# Patient Record
Sex: Female | Born: 1960 | Race: White | Hispanic: No | Marital: Married | Smoking: Never smoker
Health system: Southern US, Community
[De-identification: ages and names within clinical notes are randomized; demographics above are authoritative.]

## PROBLEM LIST (undated history)

## (undated) DIAGNOSIS — Z9889 Other specified postprocedural states: Secondary | ICD-10-CM

## (undated) DIAGNOSIS — G43909 Migraine, unspecified, not intractable, without status migrainosus: Secondary | ICD-10-CM

## (undated) DIAGNOSIS — Q159 Congenital malformation of eye, unspecified: Secondary | ICD-10-CM

## (undated) DIAGNOSIS — Q8502 Neurofibromatosis, type 2: Secondary | ICD-10-CM

## (undated) DIAGNOSIS — D320 Benign neoplasm of cerebral meninges: Secondary | ICD-10-CM

## (undated) DIAGNOSIS — M797 Fibromyalgia: Secondary | ICD-10-CM

## (undated) DIAGNOSIS — Z87442 Personal history of urinary calculi: Secondary | ICD-10-CM

## (undated) DIAGNOSIS — N200 Calculus of kidney: Secondary | ICD-10-CM

## (undated) DIAGNOSIS — Z8489 Family history of other specified conditions: Secondary | ICD-10-CM

## (undated) DIAGNOSIS — E039 Hypothyroidism, unspecified: Secondary | ICD-10-CM

## (undated) DIAGNOSIS — R0600 Dyspnea, unspecified: Secondary | ICD-10-CM

## (undated) DIAGNOSIS — M1990 Unspecified osteoarthritis, unspecified site: Secondary | ICD-10-CM

## (undated) DIAGNOSIS — E785 Hyperlipidemia, unspecified: Secondary | ICD-10-CM

## (undated) DIAGNOSIS — R569 Unspecified convulsions: Secondary | ICD-10-CM

## (undated) DIAGNOSIS — T883XXA Malignant hyperthermia due to anesthesia, initial encounter: Secondary | ICD-10-CM

## (undated) DIAGNOSIS — E063 Autoimmune thyroiditis: Secondary | ICD-10-CM

## (undated) DIAGNOSIS — R112 Nausea with vomiting, unspecified: Secondary | ICD-10-CM

## (undated) DIAGNOSIS — F419 Anxiety disorder, unspecified: Secondary | ICD-10-CM

## (undated) DIAGNOSIS — G40909 Epilepsy, unspecified, not intractable, without status epilepticus: Secondary | ICD-10-CM

## (undated) DIAGNOSIS — H4901 Third [oculomotor] nerve palsy, right eye: Secondary | ICD-10-CM

## (undated) DIAGNOSIS — H0520 Unspecified exophthalmos: Secondary | ICD-10-CM

## (undated) DIAGNOSIS — E7800 Pure hypercholesterolemia, unspecified: Secondary | ICD-10-CM

## (undated) DIAGNOSIS — H02103 Unspecified ectropion of right eye, unspecified eyelid: Secondary | ICD-10-CM

## (undated) DIAGNOSIS — D329 Benign neoplasm of meninges, unspecified: Secondary | ICD-10-CM

## (undated) HISTORY — PX: COLONOSCOPY: SHX174

## (undated) HISTORY — DX: Fibromyalgia: M79.7

## (undated) HISTORY — PX: WRIST ARTHROPLASTY: SHX1088

## (undated) HISTORY — PX: EYE SURGERY: SHX253

## (undated) HISTORY — DX: Migraine, unspecified, not intractable, without status migrainosus: G43.909

## (undated) HISTORY — PX: OTHER SURGICAL HISTORY: SHX169

## (undated) HISTORY — PX: BRAIN SURGERY: SHX531

## (undated) HISTORY — DX: Unspecified osteoarthritis, unspecified site: M19.90

## (undated) HISTORY — DX: Neurofibromatosis, type 2: Q85.02

## (undated) HISTORY — DX: Calculus of kidney: N20.0

## (undated) HISTORY — DX: Benign neoplasm of cerebral meninges: D32.0

## (undated) HISTORY — PX: UPPER GI ENDOSCOPY: SHX6162

## (undated) HISTORY — PX: BACK SURGERY: SHX140

## (undated) HISTORY — PX: SHOULDER SURGERY: SHX246

## (undated) HISTORY — DX: Autoimmune thyroiditis: E06.3

## (undated) HISTORY — PX: CARPAL TUNNEL RELEASE: SHX101

## (undated) HISTORY — DX: Unspecified ectropion of right eye, unspecified eyelid: H02.103

## (undated) HISTORY — DX: Benign neoplasm of meninges, unspecified: D32.9

## (undated) HISTORY — PX: CRANIOTOMY: SHX93

## (undated) HISTORY — DX: Epilepsy, unspecified, not intractable, without status epilepticus: G40.909

## (undated) HISTORY — PX: BRAIN TUMOR EXCISION: SHX577

## (undated) HISTORY — DX: Neurofibromatosis, type 2 (CMS-HCC): Q85.02

## (undated) HISTORY — DX: Pure hypercholesterolemia, unspecified: E78.00

## (undated) HISTORY — DX: Third (oculomotor) nerve palsy, right eye: H49.01

## (undated) HISTORY — DX: Hypothyroidism, unspecified: E03.9

## (undated) HISTORY — DX: Unspecified exophthalmos: H05.20

---

## 2002-03-06 HISTORY — PX: LASIK: SHX215

## 2012-12-26 DIAGNOSIS — D496 Neoplasm of unspecified behavior of brain: Secondary | ICD-10-CM | POA: Insufficient documentation

## 2013-05-21 ENCOUNTER — Ambulatory Visit: Payer: Self-pay | Admitting: Ophthalmology

## 2013-07-07 ENCOUNTER — Ambulatory Visit: Payer: Self-pay | Admitting: Ophthalmology

## 2013-07-09 ENCOUNTER — Ambulatory Visit: Payer: Self-pay | Admitting: Ophthalmology

## 2013-09-21 DIAGNOSIS — D72819 Decreased white blood cell count, unspecified: Secondary | ICD-10-CM | POA: Insufficient documentation

## 2013-09-21 DIAGNOSIS — N3000 Acute cystitis without hematuria: Secondary | ICD-10-CM | POA: Insufficient documentation

## 2013-09-21 DIAGNOSIS — B349 Viral infection, unspecified: Secondary | ICD-10-CM | POA: Insufficient documentation

## 2013-09-21 DIAGNOSIS — R569 Unspecified convulsions: Secondary | ICD-10-CM | POA: Insufficient documentation

## 2013-09-22 ENCOUNTER — Ambulatory Visit: Payer: Self-pay | Admitting: Ophthalmology

## 2013-12-24 ENCOUNTER — Ambulatory Visit: Payer: Self-pay | Admitting: Ophthalmology

## 2013-12-25 ENCOUNTER — Ambulatory Visit: Payer: Self-pay

## 2014-01-08 ENCOUNTER — Ambulatory Visit: Payer: Self-pay | Admitting: Ophthalmology

## 2014-02-04 ENCOUNTER — Ambulatory Visit: Payer: Self-pay | Admitting: Neurology

## 2014-02-04 ENCOUNTER — Ambulatory Visit: Payer: Self-pay

## 2014-02-23 ENCOUNTER — Ambulatory Visit: Payer: Self-pay | Admitting: Neurology

## 2014-03-26 ENCOUNTER — Ambulatory Visit: Payer: Self-pay | Admitting: Neurology

## 2014-05-06 ENCOUNTER — Ambulatory Visit: Payer: Self-pay | Admitting: Neurology

## 2014-05-06 LAB — MISCELLANEOUS TEST (CHEMISTRY)

## 2014-05-06 LAB — LABCUM (HISTORIC)

## 2014-07-10 ENCOUNTER — Ambulatory Visit: Payer: Self-pay | Admitting: Neurology

## 2014-08-18 ENCOUNTER — Ambulatory Visit: Payer: Self-pay | Admitting: Neurology

## 2015-02-23 ENCOUNTER — Ambulatory Visit: Payer: Self-pay | Admitting: Neurology

## 2015-05-12 ENCOUNTER — Ambulatory Visit: Payer: Self-pay | Admitting: Ophthalmology

## 2015-06-09 ENCOUNTER — Ambulatory Visit: Payer: Self-pay | Admitting: Ophthalmology

## 2015-07-21 ENCOUNTER — Ambulatory Visit: Payer: Self-pay | Admitting: Ophthalmology

## 2015-08-04 ENCOUNTER — Ambulatory Visit: Payer: Self-pay | Admitting: Neurology

## 2015-09-14 ENCOUNTER — Ambulatory Visit: Payer: Self-pay | Admitting: Neurology

## 2015-10-18 ENCOUNTER — Ambulatory Visit: Payer: Self-pay | Admitting: Ophthalmology

## 2016-01-04 DIAGNOSIS — D329 Benign neoplasm of meninges, unspecified: Secondary | ICD-10-CM | POA: Insufficient documentation

## 2016-01-04 DIAGNOSIS — Q8502 Neurofibromatosis, type 2: Secondary | ICD-10-CM | POA: Insufficient documentation

## 2016-01-17 ENCOUNTER — Encounter: Payer: Self-pay | Admitting: Ophthalmology

## 2016-01-17 ENCOUNTER — Ambulatory Visit: Payer: BLUE CROSS/BLUE SHIELD | Attending: Ophthalmology | Admitting: Ophthalmology

## 2016-01-17 ENCOUNTER — Ambulatory Visit: Payer: Self-pay | Admitting: Ophthalmology

## 2016-01-17 DIAGNOSIS — H4901 Third [oculomotor] nerve palsy, right eye: Principal | ICD-10-CM | POA: Insufficient documentation

## 2016-01-17 DIAGNOSIS — D329 Benign neoplasm of meninges, unspecified: Secondary | ICD-10-CM

## 2016-01-17 DIAGNOSIS — Q8502 Neurofibromatosis, type 2: Secondary | ICD-10-CM

## 2016-01-17 DIAGNOSIS — G40909 Epilepsy, unspecified, not intractable, without status epilepticus: Secondary | ICD-10-CM

## 2016-01-17 MED ORDER — HYDROCODONE-ACETAMINOPHEN 10-325 MG OR TABS: 1.00 | ORAL_TABLET | Freq: Four times a day (QID) | ORAL | Status: AC | PRN

## 2016-01-17 MED ORDER — ACETYLCYSTEINE 500 MG PO CAPS
1.00 | ORAL_CAPSULE | Freq: Two times a day (BID) | ORAL | Status: DC
Start: ? — End: 2017-01-17

## 2016-01-17 MED ORDER — LEVOTHYROXINE SODIUM 100 MCG OR TABS: 88.00 ug | ORAL_TABLET | Freq: Every day | ORAL | Status: AC

## 2016-01-17 MED ORDER — FENTANYL 62.5 MCG/HR TD PT72
1.00 | MEDICATED_PATCH | TRANSDERMAL | Status: DC
Start: ? — End: 2017-01-17

## 2016-01-17 MED ORDER — OMEGA-3 FISH OIL 1000 MG PO CAPS
1.00 g | ORAL_CAPSULE | Freq: Every day | ORAL | Status: DC
Start: ? — End: 2017-01-17

## 2016-01-17 MED ORDER — FOLIC ACID 1 MG OR TABS
1.00 mg | ORAL_TABLET | Freq: Every day | ORAL | Status: DC
Start: ? — End: 2017-05-16

## 2016-01-17 MED ORDER — VITAMIN B-12 1000 MCG OR TABS
1000.00 ug | ORAL_TABLET | Freq: Every day | ORAL | Status: DC
Start: ? — End: 2017-09-12

## 2016-01-17 MED ORDER — LEVETIRACETAM 1000 MG OR TABS
1000.00 mg | ORAL_TABLET | Freq: Two times a day (BID) | ORAL | Status: DC
Start: ? — End: 2017-01-17

## 2016-01-17 MED ORDER — MAGNESIUM 400 MG PO CAPS
400.00 mg | ORAL_CAPSULE | Freq: Every day | ORAL | Status: DC
Start: ? — End: 2017-01-17

## 2016-01-17 MED ORDER — CLONIDINE HCL 0.2 MG OR TABS: 0.20 mg | ORAL_TABLET | Freq: Two times a day (BID) | ORAL | Status: AC

## 2016-01-17 MED ORDER — FLUTICASONE PROPIONATE  HFA 44 MCG/ACT IN AERO
1.00 | INHALATION_SPRAY | RESPIRATORY_TRACT | Status: DC | PRN
Start: ? — End: 2017-05-16

## 2016-01-17 MED ORDER — VITAMIN D3 1000 UNIT OR TABS
1000.00 [IU] | ORAL_TABLET | Freq: Every day | ORAL | Status: DC
Start: ? — End: 2017-09-12

## 2016-01-17 MED ORDER — ONDANSETRON HCL 8 MG OR TABS
8.00 mg | ORAL_TABLET | Freq: Three times a day (TID) | ORAL | Status: DC | PRN
Start: ? — End: 2017-05-16

## 2016-01-17 MED ORDER — TRIAMTERENE-HCTZ 37.5-25 MG OR TABS
1.00 | ORAL_TABLET | Freq: Every day | ORAL | Status: DC
Start: ? — End: 2017-05-16

## 2016-01-17 MED ORDER — ALPHA LIPOIC ACID PO
1.00 | Freq: Every day | ORAL | Status: DC
Start: ? — End: 2017-01-17

## 2016-01-17 MED ORDER — CLONAZEPAM 1 MG OR TABS
1.00 mg | ORAL_TABLET | Freq: Two times a day (BID) | ORAL | Status: DC
Start: ? — End: 2017-09-12

## 2016-01-17 MED ORDER — DOCUSATE SODIUM 100 MG OR CAPS
100.00 mg | ORAL_CAPSULE | Freq: Two times a day (BID) | ORAL | Status: DC
Start: ? — End: 2017-01-17

## 2016-01-17 NOTE — Progress Notes (Signed)
ICD-10-CM ICD-9-CM    1. 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 Meningioma (CMS-HCC) D32.9 225.2    4. 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  She developed a seizure disorder as well following surgical removal.  The seizure disorder was extremely difficult to control with 2-3 seizures per week.  This is despite maximal medical therapy as well as a vagal nerve stimulator.  She was told to patch the right eye and her seizure disorder significantly improved.  She is now having 1 seizure every 3 weeks.  She is beginning to taper some of her seizure medications as well.    Patient's exam is largely unchanged.  She still has a partial third nerve palsy including ptosis of the right upper eyelid.  She wears a patch full time except for exam.    Overall I believe she is doing quite well.  Her OCT does not demonstrate any evidence of compressive neuropathy at this time.  I will see her back in 4 months to recheck.        I performed the above HPI. I reviewed and confirmed the techs review of systems, past histories, and readings.

## 2016-03-03 ENCOUNTER — Telehealth: Payer: Self-pay | Admitting: Neurology

## 2016-03-03 NOTE — Telephone Encounter (Signed)
I left a message informing patient that Dr. Vickey Sages will be out of the office on 03/14/2016 and her appointment at 2:30PM will need to be reschedule.

## 2016-03-08 ENCOUNTER — Telehealth: Payer: Self-pay | Admitting: Neurology

## 2016-03-08 NOTE — Telephone Encounter (Signed)
Patient called stating she called to reschedule her appointmnet to see Dr.Bota for 1/23, but she wants to know if she should have an MRI , she has not gotten one done recently.

## 2016-03-14 ENCOUNTER — Ambulatory Visit: Payer: BLUE CROSS/BLUE SHIELD | Admitting: Neurology

## 2016-03-14 ENCOUNTER — Ambulatory Visit: Payer: Self-pay | Admitting: Neurology

## 2016-03-28 ENCOUNTER — Telehealth: Payer: Self-pay | Admitting: Neurology

## 2016-03-28 ENCOUNTER — Ambulatory Visit: Payer: BLUE CROSS/BLUE SHIELD | Admitting: Neurology

## 2016-03-28 DIAGNOSIS — D329 Benign neoplasm of meninges, unspecified: Secondary | ICD-10-CM

## 2016-03-28 DIAGNOSIS — Q8502 Neurofibromatosis, type 2: Secondary | ICD-10-CM

## 2016-03-28 NOTE — Progress Notes (Signed)
MRI brain and IAC ordered. Message sent to [place]. I spoke with patient to inform her. Appt rescheduled to 04-11-16. Patient has new insurance ID# [id number] Group# [id number] - Husband Lakota Olguin DOB# 07/01[birth year]63.

## 2016-03-28 NOTE — Telephone Encounter (Signed)
Patient had called asking to speak to you regarding her scheduling.  Patient stated you had spoken to her in the AM.  Please call her back at [phone]

## 2016-03-28 NOTE — Telephone Encounter (Signed)
Patient is scheduled for MRI brain/IAC on 04-20-16. Follow up rescheduled to 04-25-16.

## 2016-04-11 ENCOUNTER — Ambulatory Visit: Payer: BLUE CROSS/BLUE SHIELD | Admitting: Neurology

## 2016-04-25 ENCOUNTER — Encounter: Payer: Self-pay | Admitting: Neurology

## 2016-04-25 ENCOUNTER — Ambulatory Visit: Payer: BC Managed Care – HMO | Admitting: Neurology

## 2016-04-25 VITALS — BP 144/87 | HR 110 | Temp 98.1°F | Resp 20 | Ht 64.0 in | Wt 135.4 lb

## 2016-04-25 DIAGNOSIS — D329 Benign neoplasm of meninges, unspecified: Principal | ICD-10-CM

## 2016-04-25 DIAGNOSIS — Q8502 Neurofibromatosis, type 2: Secondary | ICD-10-CM

## 2016-04-25 DIAGNOSIS — R569 Unspecified convulsions: Secondary | ICD-10-CM

## 2016-04-25 DIAGNOSIS — G529 Cranial nerve disorder, unspecified: Secondary | ICD-10-CM

## 2016-04-25 DIAGNOSIS — H532 Diplopia: Secondary | ICD-10-CM

## 2016-04-25 MED ORDER — GABAPENTIN 300 MG OR CAPS
ORAL_CAPSULE | ORAL | Status: DC
Start: 2016-03-07 — End: 2017-05-16

## 2016-04-25 MED ORDER — LORAZEPAM 1 MG OR TABS
ORAL_TABLET | ORAL | Status: AC
Start: 2016-04-22 — End: ?

## 2016-04-25 MED ORDER — FENTANYL 50 MCG/HR TD PT72
MEDICATED_PATCH | TRANSDERMAL | Status: DC
Start: 2016-04-18 — End: 2017-01-17

## 2016-04-25 MED ORDER — ESTRACE 0.1 MG/GM VA CREA
TOPICAL_CREAM | VAGINAL | Status: DC
Start: 2016-04-22 — End: 2017-09-12

## 2016-04-25 MED ORDER — SYNTHROID 112 MCG OR TABS
ORAL_TABLET | ORAL | Status: DC
Start: 2016-03-18 — End: 2017-01-17

## 2016-04-25 MED ORDER — LEVETIRACETAM 500 MG OR TABS
ORAL_TABLET | ORAL | Status: AC
Start: 2016-02-13 — End: ?

## 2016-04-25 NOTE — Progress Notes (Signed)
NEURO-ONCOLOGY PROGRESS NOTE     04/25/16     History of Present Illness:    Patient is a 56 year old woman with history of 3rd, 4th and 5th (V1 and V2) nerve palsy due to a meningioma status post craniotomy and resection and L spine meningioma status post resection by Dr. Marlana Salvage at [facility] who presents as a referral from Dr. Milinda Cave for genetic testing. Patient has a history of a meningioma of the spine in the thoracic region who presented with unexplained back pain and ultimately was found to have a meningioma compressing the spinal cord. She had surgery in 2012 and was left with persistent right-sided leg weakness. This has improved with physical therapy 2 days per week.   Regarding brain meningioma, in May 2014 the patient noted that she was leaning to the right. She had numbness in the pinky and fourth fingers of both hands. Her buttocks also became numb when sitting. She eventually saw neurologist and MRI brain was performed on 10/24/12 that showed a large right sphenoid wing extra-axial avidly enhancing mass with appearance characteristic of a meningioma. The mass extended into the right cavernous sinus and into the sella turcica that could not be separated from the pituitary gland. Mass effect on the cranial nerve was transversing the right cavernous sinus, right frontal lobe, and the posterior aspect of the right optic nerve. On 12/26/2012, Dr. Glennon Mac performed a frontotemporal craniotomy and subtotal resection of intradural tumor complicated by right frontal hemorrhage and postoperative seizures. After surgery, patient had right lid ptosis and 3rd nerve palsy. Patient has double vision when both of her eyes are open.    Interval History:    04/25/2016 RTC today to follow up with MRI brain. The patient reported that she had recurrent seizures x2 around 1 am today. Her husband stated that her body was rigid. The patient stated that she still feels foggy. During  our converstaion, the patient had recurrent generalized seizures x2, each episode lasted for less than 2 minutes; Vital signs were stable after seizure. The patient woke up and able to answer questions after the seizure episodes. After approximately 5 -17mins resting, the patient and her husband prefers to go home. Refused to go to ED. The patient reported that her epilepsy specialist Dr. Edsel Petrin has been adjusting her VNS and lowering her Keppra dosage. She saw him last week.        Past Medical History:   Diagnosis Date    Central NF2 neurofibromatosis (CMS-HCC)     Ectropion of right eye     Epilepsia (CMS-HCC)     Exophthalmus     Fibromyalgia     High cholesterol     High cholesterol     Hypothyroidism     Meningioma (CMS-HCC)     Third nerve palsy of right eye         Allergies   Allergen Reactions    Latex Other    Penicillins Other    Sulfa Drugs Other          Review of Systems:                12-point-review system has been completed. No significant symptoms except as described in the HPI and interval history    PROBLEM LISTS  No diagnosis found.       Current Medications:      Current Outpatient Prescriptions:     Acetylcysteine 500 MG CAPS, Take 1 capsule by mouth 2 times  daily., Disp: , Rfl:     ALPHA LIPOIC ACID PO, Take 1 tablet by mouth daily., Disp: , Rfl:     Cholecalciferol (VITAMIN D3) 1000 UNITS tablet, Take 1,000 Units by mouth daily., Disp: , Rfl:     clonazePAM (KLONOPIN) 1 MG tablet, Take 1 mg by mouth 2 times daily., Disp: , Rfl:     cloNIDine (CATAPRESS) 0.2 MG tablet, Take 0.2 mg by mouth 2 times daily., Disp: , Rfl:     docusate sodium (COLACE) 100 MG capsule, Take 100 mg by mouth 2 times daily., Disp: , Rfl:     ESTRACE VAGINAL 0.1 MG/GM vaginal cream, , Disp: , Rfl:     fentaNYL (DURAGESIC) 50 MCG/HR patch, , Disp: , Rfl:     FentaNYL 62.5 MCG/HR PT72, Apply 1 patch topically every 72 hours., Disp: , Rfl:      fluticasone (FLOVENT HFA) 44 MCG/ACT inhaler, Inhale 1 puff by mouth as needed., Disp: , Rfl:     folic acid (FOLVITE) 1 MG tablet, Take 1 mg by mouth daily., Disp: , Rfl:     gabapentin (NEURONTIN) 300 MG capsule, , Disp: , Rfl:     HYDROcodone-acetaminophen (NORCO) 10-325 MG tablet, Take 1 tablet by mouth every 6 hours as needed for Moderate Pain (Pain Score 4-6)., Disp: , Rfl:     levETIRAcetam (KEPPRA) 1000 MG tablet, Take 1,000 mg by mouth 2 times daily., Disp: , Rfl:     levETIRAcetam (KEPPRA) 500 MG tablet, , Disp: , Rfl:     levothyroxine (SYNTHROID) 100 MCG tablet, Take 100 mcg by mouth every morning (before breakfast)., Disp: , Rfl:     LORazepam (ATIVAN) 1 MG tablet, , Disp: , Rfl:     magnesium oxide 400 MG capsule, Take 400 mg by mouth daily., Disp: , Rfl:     omega-3 fatty acids, OTC, 1000 MG CAPS, Take 1 g by mouth daily (with food)., Disp: , Rfl:     ondansetron (ZOFRAN) 8 MG tablet, Take 8 mg by mouth every 8 hours as needed for Nausea/Vomiting., Disp: , Rfl:     SYNTHROID 112 MCG tablet, , Disp: , Rfl:     triamterene-hydrochlorothiazide (MAXZIDE) 37.5-25 MG tablet, Take 1 tablet by mouth daily., Disp: , Rfl:     vitamin B-12 (CYANOCOBALAMIN) 1000 MCG tablet, Take 1,000 mcg by mouth daily., Disp: , Rfl:         General status:   Nutrition: @RRDIET @   Pain: Pain Score: 7    Vital Signs:  Temperature:  [98.1 F (36.7 C)] 98.1 F (36.7 C) (02/20 1444)  Blood pressure (BP): (134-144)/(83-87) 144/87 (02/20 1509)  Heart Rate:  [110-112] 110 (02/20 1509)  Respirations:  [18-20] 20 (02/20 1509)  Pain Score: 7 (02/20 1444)  SpO2:  [99 %] 99 % (02/20 1509)     Wt Readings from Last 1 Encounters:   04/25/16 61.4 kg (135 lb 5.8 oz)         Physical Exam:  General: The patient is awake and alert, and she had 2 seizure episodes during examination  HEENT: Normocephalic, atraumatic. wearing right eye patch, No conjunctivitis, epistaxis, or mucositis.   Mucus membranes are moist.    Extremities and Skin: dry and intact  Neurological Exam:   Mental Status: The patient is awake, alert, and oriented. Attention intact. Fund of knowledge is intact. Speech is clear.   Cranial nerves: Pupils equal, round, and reactive to light and accommodation. Extraocular muscles is limited in her right  eye, visual fields are impaired due to double vision.   Facial sensation is impaired in her right trigeminal sensation 1 through 3. No facial droop. Hearing: positive for tinnitus in her left ear. Tongue is midline.   Motor : 5/5 BUE/BLE  Sensory: UTA due to seizures  Coordination: UTA due to seizures   Gait: deferred    Diagnostic Data:  Brain MRI and IAC 04/20/2016 vs 08/19/2015  --Stable appearance to the right-sided skull base meningioma      Laboratory data:  No visits with results within 1 Day(s) from this visit.  Latest known visit with results is:    Office Visit on 05/06/2014   Component Date Value Ref Range Status    Misc Test Name (Chem) 05/06/2014 NEUROFIBROMATOSIS TYPE 1 NF1   Final    Misc (Chem) Reference Lab 05/06/2014    Final                    Value:          Test Referred to:   [facility]   [address]   [place], [place]                [place] (Chem) Result 05/06/2014 Report sent to Medical Records and is viewable in [place].   Final    Misc Test Name (Chem) 05/06/2014 NEUROFIBROMATOSIS TYPE 2   Final    Misc (Chem) Reference Lab 05/06/2014    Final                    Value:          Test Referred to:   [facility]   [address]   [place], [place]                [place] (Chem) Result 05/06/2014 Report sent to Medical Records and is viewable in [place].   Final    Cumulative Res Summ 05/06/2014    Final                    Value:                                  [facility]                                                   Department of Pathology & Laboratory Medicine                                            [address] [place] [id number]                      [facility]                              Name: Sternberg,Paisley A              DOB: 05-25-1960  Niel HummerCandelaria Celeste       H#  : [id number]                       Age: 65Y   Gender: F    Acct: [id number]                    MD : Fortunato Curling (MD)                                                       [phone]      Coll: 05/06/2014  14:00       Rec: 05/06/2014  14:04                  Ord MD: Fortunato Curling (MD)   MD# [id number]                      Oth ID:                             MISCELLANEOUS TEST                                                                      TEST NAME                                                                              NEUROFIBROMATOSIS TYPE 1 NF1          REFERENCE LAB                                                                                                    Test Referred to:                 [facility]                 [address]                 [place], [place]                                 RESULTS  Report sent to Medical Records and is viewable in [place].          [id number]      Coll: 05/06/2014  14:00       Rec: 05/06/2014  14:06                  Ord MD: Fortunato Curling (MD)   MD# [phone]                      Oth ID:                             MISCELLANEOUS TEST                                                                      TEST NAME                                                                              NEUROFIBROMATOSIS TYPE 2          REFERENCE LAB                                                                          Test Referred to:                 [facility]                 [address]                                           [place], [place]                                  RESULTS                                                                                Report sent to Medical Records and is viewable in [place].  END OF REPORT           Name: Pigeon,Hind A                                  FINAL CHART REPORT    06/27/2014  03:05                                                Page    1              Assessment and Plan:  1. Multifocal Meningioma-  s/p right sphenoid wing meningioma resection in 2012. Extending into right cavernous sinus, sella turcica and sticking to pituitary gland.  2. L-spine meningioma, s/p resection in 2012  3. RIght 3rd, 4th and partial 5th CN palsy.   4. 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 Seizure, uncontrolled  6. H/o intracranial hemorrhage   7. 4th and 5th finger numbness/tingling-peripheral neuropathy versus c-spine radiculopathy  8. Hypothyroidism and HLD  9. Tinnitus    PLANS:  1. NF-2 mutation confirmed both in blood and tumor.  - Octreotide scan positive for signal  - MRI brain stable on 04/20/16  - RTC in 6 months with MRI brain  2. Recommend to follow up with epilepsy specialist for her seizure management. Currently on VNS, Keppra and Topamax  -Recommend the patient to go to ED due to her recurrent seizures x4 today. The patient and husband refused and would like to go to home now.  --Recommend the patient to follow up with her epilepsy specialist ASAP. The patient refused to increase Keppra dosage in the clinic today  --no driving until seizures well-controlled, and no seizures in 6 months. Husband confirmed that the patient doesn't drive.    3. F/u with PCP for management of HLD and continue synthroid  4. Follow diplopia with neuro-opthalmology   - stable visit on 01/17/2016    I have seen and evaluated the patient with the nurse practitioner. I have reviewed and formulated the imaging evaluation. I agree with her assessment and plan.

## 2016-05-29 ENCOUNTER — Ambulatory Visit: Payer: BC Managed Care – HMO | Admitting: Ophthalmology

## 2016-07-10 ENCOUNTER — Ambulatory Visit: Payer: BC Managed Care – HMO | Admitting: Ophthalmology

## 2016-09-18 ENCOUNTER — Encounter: Payer: Self-pay | Admitting: Ophthalmology

## 2016-09-18 ENCOUNTER — Ambulatory Visit: Payer: BLUE CROSS/BLUE SHIELD | Attending: Ophthalmology | Admitting: Ophthalmology

## 2016-09-18 DIAGNOSIS — R569 Unspecified convulsions: Secondary | ICD-10-CM

## 2016-09-18 DIAGNOSIS — Q8502 Neurofibromatosis, type 2: Secondary | ICD-10-CM

## 2016-09-18 DIAGNOSIS — D329 Benign neoplasm of meninges, unspecified: Principal | ICD-10-CM | POA: Insufficient documentation

## 2016-09-18 DIAGNOSIS — H4901 Third [oculomotor] nerve palsy, right eye: Secondary | ICD-10-CM | POA: Insufficient documentation

## 2016-09-18 NOTE — Progress Notes (Signed)
ICD-10-CM ICD-9-CM    1. Meningioma (CMS-HCC) D32.9 225.2 OCT, Optic Nerve - OU - Both Eyes   2. 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 Seizure (CMS-HCC) R56.9 780.39        1. Third nerve palsy of right eye  With abberent regeneration. No diplopia due to full time patching. Would like to fix ptosis/contralateral lid retraction. Patient seizure frequency was 3x/week prior to full time patching and is now 4-5x/month.     Recommendations:  1) ref to Dr. Lutricia Feil for lid repair      2) Continue patching full time      3) RTC 48M with OCT      2. Meningioma (CMS-HCC)  Stable appearance of MRI     Recommendations:  1) f/u with Dr. Vickey Sages  - OCT, Optic Nerve - OU - Both Eyes    3. Central NF2 neurofibromatosis (CMS-HCC)  F/u with Dr. Vickey Sages    4. Seizure (CMS-HCC)  As above      I performed the above HPI. I reviewed and confirmed the techs review of systems, past histories, and readings.

## 2016-10-04 ENCOUNTER — Ambulatory Visit
Payer: BC Managed Care – HMO | Attending: Ophthalmic Plastic and Reconstructive Surgery | Admitting: Ophthalmic Plastic and Reconstructive Surgery

## 2016-10-04 ENCOUNTER — Encounter: Payer: Self-pay | Admitting: Ophthalmic Plastic and Reconstructive Surgery

## 2016-10-04 DIAGNOSIS — H02409 Unspecified ptosis of unspecified eyelid: Secondary | ICD-10-CM | POA: Insufficient documentation

## 2016-10-04 DIAGNOSIS — H4901 Third [oculomotor] nerve palsy, right eye: Principal | ICD-10-CM

## 2016-10-04 NOTE — Progress Notes (Signed)
ICD-10-CM ICD-9-CM    1. 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 Third nerve palsy of right eye  Consider sling for ptosis but sever diplopia that causes seizures. Patch = no seizures!    L eye has some Herings ptosis prob due to squinting effect on opposite R eye (prob to reduce double images)    Rec cont patch.      I performed the above HPI. I reviewed and confirmed the techs review of systems, past histories, and readings.

## 2017-01-17 ENCOUNTER — Ambulatory Visit: Payer: BLUE CROSS/BLUE SHIELD | Attending: Ophthalmology | Admitting: Ophthalmology

## 2017-01-17 ENCOUNTER — Encounter: Payer: Self-pay | Admitting: Ophthalmology

## 2017-01-17 DIAGNOSIS — H4901 Third [oculomotor] nerve palsy, right eye: Principal | ICD-10-CM | POA: Insufficient documentation

## 2017-01-17 DIAGNOSIS — D329 Benign neoplasm of meninges, unspecified: Secondary | ICD-10-CM | POA: Insufficient documentation

## 2017-01-17 DIAGNOSIS — R569 Unspecified convulsions: Secondary | ICD-10-CM | POA: Insufficient documentation

## 2017-01-17 DIAGNOSIS — Q8502 Neurofibromatosis, type 2: Secondary | ICD-10-CM | POA: Insufficient documentation

## 2017-01-17 NOTE — Progress Notes (Signed)
ICD-10-CM ICD-9-CM    1. 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 Meningioma (CMS-HCC) D32.9 225.2    3. 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 Seizure (CMS-HCC) R56.9 780.39        1. Third nerve palsy of right eye  With abberent regeneration. No diplopia due to full time patching. Saw Dr. Lutricia Feil and no surgery recommended.  Patient seizure frequency is still way down. Now needs to have VNS exchange.    Recommendations:  1) ref to Dr. Lutricia Feil for lid repair      2) Continue patching full time      3) RTC 46M with OCT      2. Meningioma (CMS-HCC)  Stable appearance of MRI     Recommendations:  1) f/u with Dr. Vickey Sages  - OCT, Optic Nerve - OU - Both Eyes    3. Central NF2 neurofibromatosis (CMS-HCC)  F/u with Dr. Vickey Sages    4. Seizure (CMS-HCC)  As above      I performed the above HPI. I reviewed and confirmed the techs review of systems, past histories, and readings.

## 2017-05-16 ENCOUNTER — Ambulatory Visit: Payer: BLUE CROSS/BLUE SHIELD | Attending: Ophthalmology | Admitting: Ophthalmology

## 2017-05-16 ENCOUNTER — Encounter: Payer: Self-pay | Admitting: Ophthalmology

## 2017-05-16 DIAGNOSIS — D329 Benign neoplasm of meninges, unspecified: Principal | ICD-10-CM | POA: Insufficient documentation

## 2017-05-16 DIAGNOSIS — R569 Unspecified convulsions: Secondary | ICD-10-CM | POA: Insufficient documentation

## 2017-05-16 DIAGNOSIS — H4901 Third [oculomotor] nerve palsy, right eye: Secondary | ICD-10-CM | POA: Insufficient documentation

## 2017-05-16 MED ORDER — FENTANYL 25 MCG/HR TD PT72
1.00 | MEDICATED_PATCH | TRANSDERMAL | Status: DC
Start: ? — End: 2017-09-12

## 2017-05-16 MED ORDER — OXYBUTYNIN CHLORIDE 5 MG OR TABS
5.00 mg | ORAL_TABLET | Freq: Three times a day (TID) | ORAL | Status: DC
Start: ? — End: 2017-09-12

## 2017-05-16 MED ORDER — MEDROXYPROGESTERONE ACETATE 10 MG OR TABS
10.00 mg | ORAL_TABLET | Freq: Every day | ORAL | Status: DC
Start: ? — End: 2017-09-12

## 2017-05-16 NOTE — Progress Notes (Signed)
ICD-10-CM ICD-9-CM    1. 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 Third nerve palsy of right eye  With abberent regeneration. No diplopia due to full time patching. Saw Dr. Lutricia Feil and no surgery recommended.  Patient seizure frequency is still way down, approx 1 every 6 weeks on 1 medication and has new VNS. Marland Kitchen    Recommendations:  1) ref to Dr. Lutricia Feil for lid repair      2) Continue patching full time      3) RTC 84M with OCT      2. Meningioma (CMS-HCC)  Stable appearance of MRI     Recommendations:  1) f/u with Dr. Vickey Sages  - OCT, Optic Nerve - OU - Both Eyes    3. Central NF2 neurofibromatosis (CMS-HCC)  F/u with Dr. Vickey Sages    4. Seizure (CMS-HCC)  As above      I performed the above HPI. I reviewed and confirmed the techs review of systems, past histories, and readings.

## 2017-06-28 ENCOUNTER — Encounter: Payer: Self-pay | Admitting: Neurology

## 2017-06-28 ENCOUNTER — Telehealth: Payer: Self-pay | Admitting: Neurology

## 2017-06-28 DIAGNOSIS — D329 Benign neoplasm of meninges, unspecified: Secondary | ICD-10-CM

## 2017-06-28 DIAGNOSIS — Q8502 Neurofibromatosis, type 2: Secondary | ICD-10-CM

## 2017-06-28 NOTE — Telephone Encounter (Signed)
I called and left a message for patient to return my call to reschedule her appointment.  Patient is currently on the schedule to follow up with Dr, Vickey Sages next Tuesday 07/10/2017.  MD will not be in the office.  Please assist in rescheduling patient's appointment when she calls back.

## 2017-06-29 ENCOUNTER — Inpatient Hospital Stay: Admit: 2017-06-29 | Discharge: 2017-06-29 | Disposition: A | Discharge status: Home Health | Payer: Self-pay

## 2017-07-02 ENCOUNTER — Telehealth: Payer: Self-pay

## 2017-07-02 NOTE — Telephone Encounter (Signed)
Called and spoke with patient.  Per patient, already had brain MRI and completed spines done at [facility] last week.  Needs to call [facility] to get the MRI result and find out if IAC MRI was done, if not, we need to fax the [facility] order and call patient back to let her know.

## 2017-07-02 NOTE — Telephone Encounter (Signed)
Called pt to let him know that her Brain MRI/ MRI IAC has been authorized for [facility]. Pt not available. Left voicemail to call [facility] to schedule if possible before her follow up on 5/28. Please assist patient if patient calls with any questions.

## 2017-07-03 ENCOUNTER — Ambulatory Visit: Payer: BLUE CROSS/BLUE SHIELD | Admitting: Neurology

## 2017-07-10 ENCOUNTER — Ambulatory Visit: Payer: BLUE CROSS/BLUE SHIELD | Admitting: Neurology

## 2017-07-19 ENCOUNTER — Telehealth: Payer: Self-pay

## 2017-07-19 NOTE — Telephone Encounter (Signed)
Called [employer] records. LVM advising to fax MRI report.

## 2017-07-24 ENCOUNTER — Ambulatory Visit: Payer: BLUE CROSS/BLUE SHIELD | Admitting: Neurology

## 2017-07-31 ENCOUNTER — Ambulatory Visit: Payer: BLUE CROSS/BLUE SHIELD | Admitting: Neurology

## 2017-07-31 ENCOUNTER — Encounter: Payer: Self-pay | Admitting: Neurology

## 2017-07-31 VITALS — BP 121/69 | HR 80 | Temp 97.8°F | Resp 16 | Ht 64.0 in | Wt 143.2 lb

## 2017-07-31 DIAGNOSIS — Q8502 Neurofibromatosis, type 2: Secondary | ICD-10-CM

## 2017-07-31 DIAGNOSIS — G529 Cranial nerve disorder, unspecified: Secondary | ICD-10-CM

## 2017-07-31 DIAGNOSIS — H532 Diplopia: Secondary | ICD-10-CM

## 2017-07-31 DIAGNOSIS — R569 Unspecified convulsions: Secondary | ICD-10-CM

## 2017-07-31 DIAGNOSIS — D329 Benign neoplasm of meninges, unspecified: Principal | ICD-10-CM

## 2017-07-31 NOTE — Progress Notes (Signed)
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 Marlana Salvage at [facility] who presents as a referral from Dr. Milinda Cave for genetic testing. Patient has a history of a meningioma of the spine in the thoracic region who presented with unexplained back pain and ultimately was found to have a meningioma compressing the spinal cord. She had surgery in 2012 and was left with persistent right-sided leg weakness. This has improved with physical therapy 2 days per week.   Regarding brain meningioma, in May 2014 the patient noted that she was leaning to the right. She had numbness in the pinky and fourth fingers of both hands. Her buttocks also became numb when sitting. She eventually saw neurologist and MRI brain was performed on 10/24/12 that showed a large right sphenoid wing extra-axial avidly enhancing mass with appearance characteristic of a meningioma. The mass extended into the right cavernous sinus and into the sella turcica that could not be separated from the pituitary gland. Mass effect on the cranial nerve was transversing the right cavernous sinus, right frontal lobe, and the posterior aspect of the right optic nerve. On 12/26/2012, Dr. Glennon Mac performed a frontotemporal craniotomy and subtotal resection of intradural tumor complicated by right frontal hemorrhage and postoperative seizures. After surgery, patient had right lid ptosis and 3rd nerve palsy. Patient has double vision when both of her eyes are open.    Interval History:    04/25/2016 RTC today to follow up with MRI brain. The patient reported that she had recurrent seizures x2 around 1 am today. Her husband stated that her body was rigid. The patient stated that she still feels foggy. During  our converstaion, the patient had recurrent generalized seizures x2, each episode lasted for less than 2 minutes; Vital signs were stable after seizure. The patient woke up and able to answer questions after the seizure episodes. After approximately 5 -42mins resting, the patient and her husband prefers to go home. Refused to go to ED. The patient reported that her epilepsy specialist Dr. Edsel Petrin has been adjusting her VNS and lowering her Keppra dosage. She saw him last week.      07/31/2017 RTC today to follow up with MRI brain. S/P VNS replacement in 01/2017. She is on Keppra 500mg  daily. No seizures in the past 8 weeks. She reported that she developed post menopausal syndrome. She is on estrogen/progesterone and horomonal patch. She started to have menstrual period.       Past Medical History:   Diagnosis Date    Central NF2 neurofibromatosis (CMS-HCC)     Ectropion of right eye     Epilepsia (CMS-HCC)     Exophthalmus     Fibromyalgia     High cholesterol     High cholesterol     Hypothyroidism     Meningioma (CMS-HCC)     Third nerve palsy of right eye         Allergies   Allergen Reactions    Latex Other    Penicillins Other    Sulfa Drugs Other          Review of Systems:  12-point-review system has been completed. No significant symptoms except as described in the HPI and interval history       Current Medications:      Current Outpatient Medications:     Cholecalciferol (VITAMIN D3) 1000 UNITS tablet, Take 1,000 Units by mouth daily., Disp: , Rfl:     clonazePAM (KLONOPIN) 1 MG tablet, Take 1 mg by mouth 2 times daily., Disp: , Rfl:     cloNIDine (CATAPRESS) 0.2 MG tablet, Take 0.2 mg by mouth 2 times daily., Disp: , Rfl:     ESTRACE VAGINAL 0.1 MG/GM vaginal cream, , Disp: , Rfl:     fentaNYL (DURAGESIC) 25 MCG/HR patch, Apply 1 patch topically every 72 hours., Disp: , Rfl:     HYDROcodone-acetaminophen (NORCO) 10-325 MG tablet, Take 1 tablet by  mouth every 6 hours as needed for Moderate Pain (Pain Score 4-6)., Disp: , Rfl:     levETIRAcetam (KEPPRA) 500 MG tablet, , Disp: , Rfl:     levothyroxine (SYNTHROID) 100 MCG tablet, Take 88 mcg by mouth every morning (before breakfast).  , Disp: , Rfl:     LORazepam (ATIVAN) 1 MG tablet, , Disp: , Rfl:     medroxyPROGESTERone (PROVERA) 10 MG tablet, Take 10 mg by mouth daily., Disp: , Rfl:     oxybutynin (DITROPAN) 5 MG tablet, Take 5 mg by mouth 3 times daily., Disp: , Rfl:     vitamin B-12 (CYANOCOBALAMIN) 1000 MCG tablet, Take 1,000 mcg by mouth daily., Disp: , Rfl:         Vital Signs:   07/31/17  1446   BP: 121/69   Pulse: 80   Resp: 16   Temp: 97.8 F (36.6 C)       Physical Exam:  General: The patient is awake and alert, and she had 2 seizure episodes during examination  HEENT: Normocephalic, atraumatic. wearing right eye patch, No conjunctivitis, epistaxis, or mucositis.   Mucus membranes are moist.   Extremities and Skin: dry and intact  Neurological Exam:   Mental Status: The patient is awake, alert, and oriented. Attention intact. Fund of knowledge is intact. Speech is clear.   Cranial nerves: right eye patch. Extraocular muscles is limited in her right eye, visual fields are impaired due to double vision.   Facial sensation is impaired in her right trigeminal sensation 1 through 3. No facial droop. Hearing: positive for tinnitus in her left ear. Tongue is midline.   Motor : 5/5 BUE/BLE  Sensory: impaired to LT in her right side  Coordination: FTN intact  Gait: walks with a cane    Diagnostic Data:  Brain MRI and IAC 06/27/2017 vs 08/19/2015  --Stable appearance to the right-sided skull base meningioma    --L spine MRI showed no tumor.     Laboratory data:  No visits with results within 1 Day(s) from this visit.  Latest known visit with results is:    Office Visit on 05/06/2014   Component Date Value Ref Range Status     Misc Test Name (Chem) 05/06/2014 NEUROFIBROMATOSIS TYPE 1 NF1   Final    Misc (Chem) Reference Lab 05/06/2014    Final                    Value:          Test Referred to:   [facility]   [address]   [place], [place]  Misc (Chem) Result 05/06/2014 Report sent to Medical Records and is viewable in [place].   Final    Misc Test Name (Chem) 05/06/2014 NEUROFIBROMATOSIS TYPE 2   Final    Misc (Chem) Reference Lab 05/06/2014    Final                    Value:          Test Referred to:   [facility]   [address]   [place], [place]                [place] (Chem) Result 05/06/2014 Report sent to Medical Records and is viewable in [place].   Final    Cumulative Res Summ 05/06/2014    Final                    Value:                                  [facility]                                                   Department of Pathology & Laboratory Medicine                                           [address] [place] [address]                      [facility] M[facility]                              Name: [facility]              DOB: [birth year]            Niel HummerCandelaria Celeste       H#  : [id number]                       Age: 53Y   Gender: F    Acct: [id number]                    MD : Fortunato Curling (MD)                                                       [phone]      Coll: 05/06/2014  14:00       Rec: 05/06/2014  14:04                  Ord MD: Fortunato Curling (MD)   MD# [phone]                      Oth ID:  MISCELLANEOUS TEST                                                                      TEST NAME                                                                              NEUROFIBROMATOSIS TYPE 1 NF1          REFERENCE LAB                                                                                                    Test Referred to:                  [facility]                 [address]                 [place], [place]                                 RESULTS                                                                                Report sent to Medical Records and is viewable in [place].          [id number]      Coll: 05/06/2014  14:00       Rec: 05/06/2014  14:06                  Ord MD: Fortunato Curling (MD)   MD# [id number]                      Oth ID:                             MISCELLANEOUS TEST  TEST NAME                                                                              NEUROFIBROMATOSIS TYPE 2          REFERENCE LAB                                                                          Test Referred to:                 [facility]                 [address]                                           [place], [place]                                 RESULTS                                                                                Report sent to Medical Records and is viewable in [place].                                                                      END OF REPORT           Name: Shankles,Jesi A                                  FINAL CHART REPORT    06/27/2014  03:05                                                Page    1              Assessment and Plan:  1. Multifocal Meningioma-  s/p right sphenoid wing meningioma resection in 2012. Extending into right cavernous sinus, sella turcica and sticking to pituitary gland.  --  12/26/2012, Dr. Glennon Mac performed a frontotemporal craniotomy and subtotal resection of intradural tumor complicated by right frontal hemorrhage and postoperative seizures.  2. L-spine meningioma, s/p resection in 2012  3. RIght 3rd, 4th and partial 5th CN palsy.   4. 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 Seizure, uncontrolled  6. H/o intracranial hemorrhage   7. 4th and 5th finger numbness/tingling-peripheral neuropathy versus c-spine radiculopathy  8. Hypothyroidism and HLD  9. Tinnitus    PLANS:  #. NF-2 mutation confirmed both in blood and tumor.  - Octreotide scan positive for signal  - MRI brain stable on 06/27/2017  - RTC in 6 months with MRI brain    #. Recommend to follow up with epilepsy specialist for her seizure management. Currently on VNS, Keppra 500mg  daily      #PMS    -Currently on Estradiol 38mcg 2x/week  -Estradiol 0.075mg /24 hour patch  -Progesterone 200mg  daily 12/28 days cycle    --Recommend against systemic hormonal therapy, meningiomas have ER PR receptors and risk of potential growth on treatment replacement.    #. F/u with PCP for management of HLD and continue synthroid  #. Follow diplopia with neuro-opthalmology   - stable     I have seen the patient and discussed the above assessment and plans with Dr. Vickey Sages.      I have seen and evaluated the patient with the nurse practitioner. I have reviewed and formulated the imaging evaluation. I agree with her assessment and plan.

## 2017-08-01 ENCOUNTER — Other Ambulatory Visit: Payer: Self-pay | Admitting: Neurology

## 2017-09-12 ENCOUNTER — Ambulatory Visit: Payer: BLUE CROSS/BLUE SHIELD | Attending: Ophthalmology | Admitting: Ophthalmology

## 2017-09-12 ENCOUNTER — Encounter: Payer: Self-pay | Admitting: Ophthalmology

## 2017-09-12 DIAGNOSIS — D329 Benign neoplasm of meninges, unspecified: Secondary | ICD-10-CM | POA: Insufficient documentation

## 2017-09-12 DIAGNOSIS — R569 Unspecified convulsions: Secondary | ICD-10-CM | POA: Insufficient documentation

## 2017-09-12 DIAGNOSIS — H4901 Third [oculomotor] nerve palsy, right eye: Principal | ICD-10-CM | POA: Insufficient documentation

## 2017-09-12 MED ORDER — GABAPENTIN 100 MG OR CAPS: 100.00 mg | ORAL_CAPSULE | Freq: Three times a day (TID) | ORAL | Status: AC

## 2017-09-18 ENCOUNTER — Ambulatory Visit (INDEPENDENT_AMBULATORY_CARE_PROVIDER_SITE_OTHER): Payer: BLUE CROSS/BLUE SHIELD

## 2017-09-18 DIAGNOSIS — H900 Conductive hearing loss, bilateral: Secondary | ICD-10-CM

## 2017-09-25 ENCOUNTER — Encounter: Payer: Self-pay | Admitting: Ophthalmology

## 2017-09-25 NOTE — Progress Notes (Signed)
ICD-10-CM ICD-9-CM    1. Third nerve palsy of right eye H49.01 378.51 MRI Orbits WO/W Contrast      Consult/Referral to Audiology Clinic   2. Meningioma (CMS-HCC) D32.9 225.2 MRI Orbits WO/W Contrast      Consult/Referral to Audiology Clinic   3. Seizure (CMS-HCC) R56.9 780.39 Consult/Referral to Audiology Clinic       1. Third nerve palsy of right eye  With abberent regeneration. No diplopia due to full time patching. Saw Dr. Lutricia Feil and no surgery recommended.  Patient seizure frequency is still way down, approx 1 every 6-8 weeks on 1 medication and has new VNS.     Recommendations:  1)Continue patching full time      2) RTC 45M with OCT      2. Meningioma (CMS-HCC)  Stable appearance of MRI     Recommendations:  1) f/u with Dr. Vickey Sages  - OCT, Optic Nerve - OU - Both Eyes    3. Central NF2 neurofibromatosis (CMS-HCC)  F/u with Dr. Vickey Sages    4. Seizure (CMS-HCC)  As above      I performed the above HPI. I reviewed and confirmed the techs review of systems, past histories, and readings.

## 2017-10-01 NOTE — Anesthesia Preprocedure Evaluation (Addendum)
ANESTHESIA PRE-OPERATIVE EVALUATION    Patient Information    Name: Katelyn Oliver    MRN: [id number]    DOB: 12-02-1960    Age: 57 year old    Sex: female  Procedure(s):  MRI WITH ANESTHESIA      Pre-op Vitals:   There were no vitals taken for this visit.        Primary language spoken:  English    ROS/Medical History:       History of Present Illness: Procedure: MRI WITH ANESTHESIA    REPORTS FMHX OF MH    General:  negative for Obesity,    65 Kg BMI 24.5  Negative social hx  No intentional exercise  Cardiovascular:  negative cardio ROS    07/31/17  BP: 121/69  Pulse: 80  Resp: 16  Temp: 97.8 F (36.6 C)       Anesthesia History:  no history of anesthetic complications,  malignant hyperthermia (REPORTS FMHX OF MH),  chronic pain patient (Fibromyalgia),  family history of anesthetic complications (REPORTS FMHX OF MH),    LASIK  L spine meningioma resection   BRAIN TUMOR EXCISION  CRANIOTOMY     Miller; Blade Size: 2; Location: Oral; Grade View: 2 Pulmonary:   negative pulmonary ROS     Neuro/Psych:   seizures,    Third nerve palsy of right eye  Meningioma (CMS-HCC)   Central NF2 neurofibromatosis (CMS-HCC)    12/26/2012 frontotemporal craniotomy and subtotal resection of intradural tumor c/b right frontal hemorrhage and postoperative seizures.  After surgery, patient had right lid ptosis and 3rd nerve palsy.  Patient has double vision when both of her eyes are open.    07/31/2017  VNS replacement in 01/2017. She is on Keppra 554m daily. No seizures in the past 8 weeks. Hematology/Oncology:   hematologic/lymphatic negative      GI/Hepatic:  negative GI/hepatic ROS Infectious Disease:  negative for infectious disease     Renal:  negative renal ROS   Endocrine/Other:  no diabetes,  history of thyroid disease (Hypothyroidism),  no steriod use,  back pain,     Pregnancy History:  Currently pregnant: no,   Pediatrics:         Pre Anesthesia Testing (PCC/CPC) notes/comments:     [facility]                  Patient request: [id number]    Katelyn Oliver, Katelyn Oliver([id number] - 89year old Female     Case: [id number]/Time: 10/11/17 0815    Procedure: MRI WITH ANESTHESIA    Surgeon: Generic Provider, Proceduralist    Ready; RAlfonso PattenMuico PSH/ CRising SunNp 10/01/17 chart 11                  Last  OSA (STOP BANG) Score:  No data recorded    Last OSA  (STOP) Score for   No data recorded                 Past Medical History:   Diagnosis Date    Central NF2 neurofibromatosis (CMS-HCC)     Ectropion of right eye     Epilepsia (CMS-HCC)     Exophthalmus     Fibromyalgia     High cholesterol     High cholesterol     Hypothyroidism     Meningioma (CMS-HCC)     Third nerve palsy of right eye      Past  Surgical History:   Procedure Laterality Date    BRAIN TUMOR EXCISION      CRANIOTOMY      LASIK Bilateral 2004     Social History     Tobacco Use    Smoking status: Never Smoker    Smokeless tobacco: Never Used   Substance Use Topics    Alcohol use: No    Drug use: No       No current facility-administered medications for this encounter.      Current Outpatient Medications   Medication Sig Dispense Refill    cloNIDine (CATAPRESS) 0.2 MG tablet Take 0.2 mg by mouth 2 times daily.      gabapentin (NEURONTIN) 100 MG capsule Take 100 mg by mouth 3 times daily.      HYDROcodone-acetaminophen (NORCO) 10-325 MG tablet Take 1 tablet by mouth every 6 hours as needed for Moderate Pain (Pain Score 4-6).      levETIRAcetam (KEPPRA) 500 MG tablet       levothyroxine (SYNTHROID) 100 MCG tablet Take 88 mcg by mouth every morning (before breakfast).        LORazepam (ATIVAN) 1 MG tablet        Allergies   Allergen Reactions    Latex Other    Penicillins Other    Sulfa Drugs Other       Labs and Other Data  No results found for: NA, SODIUM, K, CL, BICARB, BUN, CREAT, GLU, [place]  No results found for: AST, ALT, GGT, LDH, ALK, TP, ALB, TBILI, DBILI   No results found for: WBC, RBC, HGB, HCT, MCV, MCHC, RDW, PLT, PLCTEL, MPV, MPVH, SEG, LYMPHS, MONOS, EOS, BASOS  No results found for: INR, PTT  No results found for: ARTPH, ARTPO2, ARTPCO2    Anesthesia Plan:  Risks and Benefits of Anesthesia  I personally examined the patient immediately prior to the anesthetic and reviewed the pertinent medical history, drug and allergy history, laboratory and imaging studies and consultations. I have determined that the patient has had adequate assessment and testing.    Anesthetic techniques, invasive monitors, anesthetic drugs for induction, maintenance and post-operative analgesia, risks and alternatives have been explained to the patient and/or patient's representatives.    I have prescribed the anesthetic plan:         Planned anesthesia method: Monitored Anesthesia Care         ASA 3 (Severe systemic disease)       No Beta Blocker Indicated: Planned monitoring method: Routine monitoring    Informed Consent:  Anesthetic plan and risks discussed with Patient.

## 2017-10-09 NOTE — Progress Notes (Addendum)
RE: [name]  MR# [id number]  DOB:Jul 05, 1960  DOS: 09/18/17    BACKGROUND INFORMATION:  Frederick Peers was referred to our office for a complete audiological evaluation.  History significant for neurofibromatosis Type 2, meningioma, subdural hematoma.  [name] reported today she feels her hearing has recently worsened somewhat, and hears intermittent tinnitus on the left side.  She also noted dizziness and a "water" sound/pressure on the left side.      AUDIOLOGICAL EVALUATION:  Hearing sensitivity was within normal to near normal limits for both ears except for a slight to mild, low-frequency conductive component at 250 Hz only.  Normal tympanograms were obtained.  Excellent speech discrimination scores (100%) in both ears.  Acoustic reflexes were present on the right side, no response on the left side.      RECOMMENDATIONS:  Follow up with managing physicians should symptoms of left aural fullness and tinnitus/dizziness reported today persist or worsen; referral to ENT may be warranted.        Audiogram in [place], [place]  [employer], [employer]  [facility]  [phone]

## 2017-10-11 ENCOUNTER — Ambulatory Visit
Admission: RE | Admit: 2017-10-11 | Discharge: 2017-10-11 | Disposition: A | Discharge status: Home / Self Care | Payer: BLUE CROSS/BLUE SHIELD | Attending: Ophthalmology | Admitting: Ophthalmology

## 2017-10-11 ENCOUNTER — Ambulatory Visit: Payer: BLUE CROSS/BLUE SHIELD

## 2017-10-11 ENCOUNTER — Ambulatory Visit (HOSPITAL_BASED_OUTPATIENT_CLINIC_OR_DEPARTMENT_OTHER): Payer: BLUE CROSS/BLUE SHIELD

## 2017-10-11 ENCOUNTER — Encounter: Admission: RE | Disposition: A | Discharge status: Home Health | Payer: Self-pay | Attending: Ophthalmology

## 2017-10-11 DIAGNOSIS — D320 Benign neoplasm of cerebral meninges: Secondary | ICD-10-CM

## 2017-10-11 DIAGNOSIS — H4901 Third [oculomotor] nerve palsy, right eye: Secondary | ICD-10-CM | POA: Insufficient documentation

## 2017-10-11 DIAGNOSIS — G9389 Other specified disorders of brain: Secondary | ICD-10-CM

## 2017-10-11 DIAGNOSIS — D329 Benign neoplasm of meninges, unspecified: Secondary | ICD-10-CM | POA: Insufficient documentation

## 2017-10-11 SURGERY — MRI WITH ANESTHESIA
Anesthesia: Monitored Anesthesia Care (MAC)

## 2017-10-11 MED ORDER — GADOTERIDOL 279.3 MG/ML IV SOLN
INTRAVENOUS | Status: AC
Start: 2017-10-11 — End: ?
  Filled 2017-10-11: qty 15

## 2017-10-11 MED ORDER — PROPOFOL 1000 MG/100ML IV EMUL
INTRAVENOUS | Status: DC | PRN
Start: 2017-10-11 — End: 2017-10-11
  Administered 2017-10-11: 20 ug/kg/min via INTRAVENOUS

## 2017-10-11 MED ORDER — HYDROCODONE-ACETAMINOPHEN 5-325 MG OR TABS
ORAL_TABLET | ORAL | Status: AC
Start: 2017-10-11 — End: ?
  Filled 2017-10-11: qty 1

## 2017-10-11 MED ORDER — MIDAZOLAM HCL 2 MG/2ML IJ SOLN
INTRAMUSCULAR | Status: AC
Start: 2017-10-11 — End: ?
  Filled 2017-10-11: qty 2

## 2017-10-11 MED ORDER — SODIUM CHLORIDE 0.9 % IV SOLN
INTRAVENOUS | Status: DC | PRN
Start: 2017-10-11 — End: 2017-10-11
  Administered 2017-10-11: 09:00:00 via INTRAVENOUS

## 2017-10-11 MED ORDER — LACTATED RINGERS IV SOLN
INTRAVENOUS | Status: DC
Start: 2017-10-11 — End: 2017-10-11

## 2017-10-11 MED ORDER — HYDROCODONE-ACETAMINOPHEN 5-325 MG OR TABS
1.0000 | ORAL_TABLET | ORAL | Status: DC | PRN
Start: 2017-10-11 — End: 2017-10-11
  Administered 2017-10-11 (×2): 1 via ORAL

## 2017-10-11 MED ORDER — MIDAZOLAM HCL 2 MG/2ML IJ SOLN
INTRAMUSCULAR | Status: DC | PRN
Start: 2017-10-11 — End: 2017-10-11
  Administered 2017-10-11 (×2): 2 mg via INTRAVENOUS

## 2017-10-11 MED ORDER — ONDANSETRON HCL 4 MG/2ML IV SOLN
4.0000 mg | Freq: Once | INTRAMUSCULAR | Status: DC | PRN
Start: 2017-10-11 — End: 2017-10-11

## 2017-10-11 MED ORDER — PROPOFOL 1000 MG/100ML IV EMUL
INTRAVENOUS | Status: AC
Start: 2017-10-11 — End: ?
  Filled 2017-10-11: qty 200

## 2017-10-11 MED ORDER — ACETAMINOPHEN 325 MG PO TABS
650.0000 mg | ORAL_TABLET | Freq: Once | ORAL | Status: DC | PRN
Start: 2017-10-11 — End: 2017-10-11

## 2017-10-11 MED ORDER — LACTATED RINGERS IV SOLN
1000.0000 mL | INTRAVENOUS | Status: DC
Start: 2017-10-11 — End: 2017-10-11

## 2017-10-11 MED ORDER — GADOTERIDOL 279.3 MG/ML IV SOLN
10.00 mL | Freq: Once | INTRAVENOUS | Status: AC
Start: 2017-10-11 — End: 2017-10-11
  Administered 2017-10-11 (×2): 10 mL via INTRAVENOUS

## 2017-10-11 NOTE — RN OR/Procedure Note (Signed)
VNS turned off by Dr. Edsel Petrin per patient, verified by Dr. Pryor Ochoa  Room in at [phone]  Sign in done with anesthesia at 0850  Scan started at 0907, completed at [phone]  Patient tolerated exam on anesthesia support.

## 2017-10-11 NOTE — Discharge Instructions (Signed)
Monitored Anesthesia Care, Care After  These instructions provide you with information about caring for yourself after your procedure. Your health care provider may also give you more specific instructions. Your treatment has been planned according to current medical practices, but problems sometimes occur. Call your health care provider if you have any problems or questions after your procedure.  What can I expect after the procedure?  After your procedure, it is common to:   Feel sleepy for several hours.   Feel clumsy and have poor balance for several hours.   Feel forgetful about what happened after the procedure.   Have poor judgment for several hours.   Feel nauseous or vomit.   Have a sore throat if you had a breathing tube during the procedure.    Follow these instructions at home:  For at least 24 hours after the procedure:     Do not:  ? Participate in activities in which you could fall or become injured.  ? Drive.  ? Use heavy machinery.  ? Drink alcohol.  ? Take sleeping pills or medicines that cause drowsiness.  ? Make important decisions or sign legal documents.  ? Take care of children on your own.   Rest.  Eating and drinking   Follow the diet that is recommended by your health care provider.   If you vomit, drink water, juice, or soup when you can drink without vomiting.   Make sure you have little or no nausea before eating solid foods.  General instructions   Have a responsible adult stay with you until you are awake and alert.   Take over-the-counter and prescription medicines only as told by your health care provider.   If you smoke, do not smoke without supervision.   Keep all follow-up visits as told by your health care provider. This is important.  Contact a health care provider if:   You keep feeling nauseous or you keep vomiting.   You feel light-headed.   You develop a rash.   You have a fever.  Get help right away if:   You have trouble breathing.  This information is  not intended to replace advice given to you by your health care provider. Make sure you discuss any questions you have with your health care provider.  Document Released: 06/13/2015 Document Revised: 10/13/2015 Document Reviewed: 06/13/2015  Elsevier Interactive Patient Education  2018 [employer]

## 2017-10-11 NOTE — RN OR/Procedure Note (Signed)
45- May discharge patient per Dr. Francene Boyers under anesthesia criteria

## 2017-10-11 NOTE — Anesthesia Postprocedure Evaluation (Signed)
Anesthesia Post Note    Patient: Katelyn Oliver    Procedure(s) Performed: Procedure(s):  MRI WITH ANESTHESIA      Final anesthesia type: Monitored Anesthesia Care    Patient location: PACU    Post anesthesia pain: adequate analgesia    Mental status: awake, alert  and oriented    Airway Patent: Yes    Last Vitals:    Vitals:    10/11/17 0815   BP: 111/57   Pulse: (!) 46   Resp: 12   Temp: 36.1 C   SpO2: 100%        Temperature >35.5: Yes    Post vital signs: stable    Hydration: adequate    N/V:no    Plan of care per primary team.

## 2017-10-12 ENCOUNTER — Telehealth: Payer: Self-pay | Admitting: Ophthalmology

## 2017-10-12 ENCOUNTER — Telehealth: Payer: Self-pay | Admitting: Neurology

## 2017-10-12 NOTE — Telephone Encounter (Addendum)
Informed patient that we received MRI result and can be discussed at next visit with Dr. Milinda Cave.

## 2017-10-12 NOTE — Telephone Encounter (Signed)
Voicemail 10/12/2017 10:22 AM Patient called is requesting to speak to Dr. Leslye Peer nurse she can be reached at [phone].

## 2017-10-12 NOTE — Telephone Encounter (Signed)
Patient states she had an MRI done 10/11/2017 and would like to know if we would have the report by her next appointment on 10/15/2017. Please call patient to further assist , thank you.

## 2017-10-14 ENCOUNTER — Other Ambulatory Visit: Payer: Self-pay

## 2017-10-15 ENCOUNTER — Ambulatory Visit: Payer: BLUE CROSS/BLUE SHIELD | Attending: Ophthalmology | Admitting: Ophthalmology

## 2017-10-15 ENCOUNTER — Encounter: Payer: Self-pay | Admitting: Ophthalmology

## 2017-10-15 DIAGNOSIS — D496 Neoplasm of unspecified behavior of brain: Principal | ICD-10-CM | POA: Insufficient documentation

## 2017-10-15 MED ORDER — ARTIFICIAL TEARS 0.1-0.3 % OP SOLN: 1.00 [drp] | Freq: Two times a day (BID) | OPHTHALMIC | Status: AC

## 2017-10-15 NOTE — Progress Notes (Signed)
ICD-10-CM ICD-9-CM    1. Intracranial tumor (CMS-HCC) D49.6 239.6        1. Third nerve palsy of right eye  Patient returns for 9-month follow-up to review imaging. Planning to move to [facility].  No diplopia with full time patching. Saw Dr. Lutricia Feil and no surgery recommended.  Patient seizure frequency is still way down, approx 1 every 6-8 weeks on 1 medication and has new VNS.     Recommendations:  1) Continue patching full time      2) Follow-up with Neuro-Ophthalmology (recommend [facility], [place])  in  86M with OCT      2. Meningioma (CMS-HCC)  Stable appearance of MRI again today     Recommendations:  1) f/u with Neuro-Oncology in [place]      3. Central NF2 neurofibromatosis (CMS-HCC)  As above    4. Seizure (CMS-HCC)  As above      I performed the above HPI. I reviewed and confirmed the techs review of systems, past histories, and readings.

## 2017-12-11 ENCOUNTER — Encounter: Payer: Self-pay | Admitting: Neurology

## 2017-12-12 ENCOUNTER — Ambulatory Visit: Payer: Self-pay | Admitting: Ophthalmology

## 2017-12-26 DIAGNOSIS — Z01411 Encounter for gynecological examination (general) (routine) with abnormal findings: Secondary | ICD-10-CM | POA: Diagnosis not present

## 2018-01-22 ENCOUNTER — Ambulatory Visit: Payer: BLUE CROSS/BLUE SHIELD | Admitting: Neurology

## 2018-02-04 DIAGNOSIS — E538 Deficiency of other specified B group vitamins: Secondary | ICD-10-CM | POA: Diagnosis not present

## 2018-02-04 DIAGNOSIS — D496 Neoplasm of unspecified behavior of brain: Secondary | ICD-10-CM | POA: Diagnosis not present

## 2018-02-04 DIAGNOSIS — G40909 Epilepsy, unspecified, not intractable, without status epilepticus: Secondary | ICD-10-CM | POA: Diagnosis not present

## 2018-02-04 DIAGNOSIS — E785 Hyperlipidemia, unspecified: Secondary | ICD-10-CM | POA: Diagnosis not present

## 2018-02-04 DIAGNOSIS — Z79899 Other long term (current) drug therapy: Secondary | ICD-10-CM | POA: Diagnosis not present

## 2018-02-04 DIAGNOSIS — E039 Hypothyroidism, unspecified: Secondary | ICD-10-CM | POA: Diagnosis not present

## 2018-02-14 DIAGNOSIS — Q8502 Neurofibromatosis, type 2: Secondary | ICD-10-CM | POA: Diagnosis not present

## 2018-02-14 DIAGNOSIS — D321 Benign neoplasm of spinal meninges: Secondary | ICD-10-CM | POA: Diagnosis not present

## 2018-02-14 DIAGNOSIS — D329 Benign neoplasm of meninges, unspecified: Secondary | ICD-10-CM | POA: Diagnosis not present

## 2018-02-14 DIAGNOSIS — G527 Disorders of multiple cranial nerves: Secondary | ICD-10-CM | POA: Diagnosis not present

## 2018-02-17 NOTE — Progress Notes (Signed)
NEUROLOGY CONSULTATION NOTE  Karolyn Messing MRN: [id number] DOB: [birth year]  Referring provider: Elwin Sleight, MD Primary care provider: Caren Macadam, MD  Reason for consult:  Headache, seizure disorder  HISTORY OF PRESENT ILLNESS: Lisa Burnett is a 57 year old right-handed female with neurofibromatosis type 2 and spinal meningioma who presents for seizure disorder and headaches.  History supplemented by notes from PCP, prior neurologist and neuro-ophthalmology.  Ms. 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  She underwent resection of right cavernous sinus meningioma in 1308 complicated by right frontal hemorrhage.  She has had epilepsy since the resection.  She subsequently was treated with CyberKnife radiation in 2015.  Current seizures present as tonic-clonic activity with loss of consciousness.  She has postictal confusion for up to 2 hours.  They last 3 to 5 minutes brief twitching of right sided facial usually.  They occur once every 3 weeks.  After a seizure, she will usually take an extra Keppra, Ativan and potassium.  She also has smaller partial seizures presenting as facial twitching with loss of awareness lasting a few seconds.  She currently takes Keppra 500mg  twice daily and gabapentin 100mg  TID (for neuralgia and PMS).  She had VNS implanted on 01/20/2014.  Prior AEDs included lamotrigine, Lyrica, diazepam, Tegretol, Trileptal, Topiramate, Depakote.  She does not drive.    VNS Settings from 10/31/17: Normal: Output current:  0.5 mA Frequency: 15 Hz PWW: 250 On-TIme: 30 sec Off-Time: 3 minutes Duty Cycle:  16^  Magnet Sttings: Output Current: 1.0 mA PW: 250 On-Time: 60  Auto stim settings Auto-Stim: .875 mA PW: 250 Auto Stim On: 30 sec  Lead Impedence: ok, 4133 ohms IFI: NO Battery: OK (75-100%)  She also developed right CN III, IV, V (V1-V2) palsies following Cyperknife radiation in 2015.  She has routine follow up with neuro-ophthalmology.  She wears a patch over her right  eye.    She underwent L3/L4 laminectomy for tumor resection in 1995.  She also underwent spinal meningioma resection of the thoracic region in 2012 which was causing myelopathy.  She has residual chronic pain and notes numbness and tightness in right thigh.  She was evaluated by neuro-ophthalmology on 02/14/18 and was found to have normal optic nerve and OCT nerve exam and full visual fields.  MRI orbits with and without contrast from 10/11/17 showed postsurgical findings related to prior right temporal craniotomy, right cavernous sinus meningioma involving right superior and inferior orbital fissures, right suprasellar cistern, and right Meckel's cave and minimally along the prepontine cistern, right anterior temporal convexity and right tentorial leaflet.  Encasement of right cavernous ICA with mildly narrowed flow void.  Findings unchanged compared to prior imaging from 06/28/17.  MRI of cervical/thoracic/lumbar spine from 06/29/17 with and without contrast showed stable degenerative disc disease of the cervical and lumbar spine with no contrast-enhancing mass lesions or myelopathy,.  She has migraines since meningioma resection in 2014.  They are severe bilateral periorbital pressure that is daily and lasts several hours.  She has associated nausea, photophobia, phonophobia and bilateral (right worse than left) paresthesias.  She also has TMJ dysfunction.  She also reports left occipital paresthesias, diagnosed with occipital neuralgia treated with nerve block.  Current NSAIDS:  none Current analgesics:  Oxycodone (daily for headache and neuralgia) Current triptans:  none Current ergotamine:  none Current anti-emetic:  none Current muscle relaxants:  none Current anti-anxiolytic:  Lorazepam 1mg  daily Current sleep aide:  none Current Antihypertensive medications:  Clonidine, triamterene/HCTZ Current Antidepressant medications:  none Current Anticonvulsant  medications:  Gabapentin 100mg   TID Current anti-CGRP:  none Current Vitamins/Herbal/Supplements:  Vitamin D, magnesium 400mg  Current Antihistamines/Decongestants:  none Other therapy:  Botox  Past NSAIDS:  Ibuprofen, naproxen, Cambia Past analgesics:  Tramadol, Norco, Fentanyl patch Past abortive triptans:  Imitrex, Maxalt, Amerge, Relpax Past abortive ergotamine:  none Past muscle relaxants:  none Past anti-emetic:  none Past antihypertensive medications:  lisinopril Past antidepressant medications:  amitriptyline Past anticonvulsant medications:  Topiramate, lamotrigine, Lyrica Past anti-CGRP:  none Past vitamins/Herbal/Supplements:  none Past antihistamines/decongestants:  none Other past therapies:  none  Caffeine: 2 cups of coffee per day Exercise: Light Depression:  no; Anxiety:  no Other pain:  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   No current facility-administered medications on file prior to visit.     ALLERGIES: Sulfonamides Lamotrigine Tramadol Penicillin Latex  FAMILY HISTORY: Mother: Migraine, malignant hyperthermia, breast cancer Cousins: Seizure disorder.  SOCIAL HISTORY: Marital status:  Married Smoking status: Non-smoker/no tobacco use Caffeine use: 2 cups of coffee per day Alcohol use: 1 glass of wine per week Drug use: No drug use Exercise: Light  REVIEW OF SYSTEMS: Constitutional: No fevers, chills, or sweats, no generalized fatigue, change in appetite Eyes: No visual  changes, double vision, eye pain Ear, nose and throat: No hearing loss, ear pain, nasal congestion, sore throat Cardiovascular: No chest pain, palpitations Respiratory:  No shortness of breath at rest or with exertion, wheezes GastrointestinaI: No nausea, vomiting, diarrhea, abdominal pain, fecal incontinence Genitourinary:  No dysuria, urinary retention or frequency Musculoskeletal:  No neck pain, back pain Integumentary: No rash, pruritus, skin lesions Neurological: as above Psychiatric: No depression, insomnia, anxiety Endocrine: No palpitations, fatigue, diaphoresis, mood swings, change in appetite, change in weight, increased thirst Hematologic/Lymphatic:  No purpura, petechiae. Allergic/Immunologic: no itchy/runny eyes, nasal congestion, recent allergic reactions, rashes  PHYSICAL EXAM: Blood pressure 100/72, pulse (!) 59, height 5\' 4"  (1.626 m), weight 150 lb (68 kg), SpO2 98 %. General: No acute distress.  Patient appears well-groomed.  Head:  Normocephalic/atraumatic Eyes:  fundi examined but not visualized Neck: supple, no paraspinal tenderness, full range of motion Back: paraspinal tenderness Heart: regular rate and rhythm Lungs: Clear to auscultation bilaterally. Vascular: No carotid bruits. Neurological Exam: Mental status: alert and oriented to person, place, and time, recent and remote memory intact, fund of knowledge intact, attention and concentration intact, speech fluent and not dysarthric, language intact. Cranial nerves: CN I: not tested CN II: pupils equal, round and reactive to light, visual fields intact CN III, IV, VI:  Right 3rd and 4th nerve palsies  CN V: reduced right V1-V2 CN VII: upper and lower face symmetric CN VIII: hearing intact CN IX, X: gag intact, uvula midline CN XI: sternocleidomastoid and trapezius muscles intact CN XII: tongue midline Bulk & Tone: normal, no fasciculations. Motor:  5/5 throughout  Sensation: temperature and vibration  sensation intact. Deep Tendon Reflexes:  2+ throughout, toes downgoing.  Finger to nose testing:  Without dysmetria.   Heel to shin:  Without dysmetria.   Gait:  Mild right antalgic gait.  Romberg negative.  IMPRESSION: 1.  Symptomatic localization related epilepsy 2.  Neurofibromatosis 2 3.  Right cavernous sinus meningioma 4.  Chronic migraine without aura, without status migrainosus, not intractable 5.  Multiple meningiomas.  PLAN: 1.  Continue Keppra 500mg  twice daily 2.  Continue post-seizure protocol:  Keppra, Ativan, potassium 3.  Will obtain pre-authorization for Botox 4.  To help with neuralgia/chronic pain, will have her increase gabapentin to 200mg  three times daily.  We will refer to pain management to manage opioids. 5.  VNS last checked on 10/31/17.  She will follow up in early March for VNS recheck (which will be approximately 6 months from last check) 6.  She has upcoming MRI of neuro-axis ordered by her neuro[facility]ophthalmologist at [facility].  Thank you for allowing me to take part in the care of this patient.  Metta Clines, DO  CC:  Elwin Sleight, MD  Caren Macadam, MD

## 2018-02-18 ENCOUNTER — Encounter: Payer: Self-pay | Admitting: *Deleted

## 2018-02-18 ENCOUNTER — Encounter

## 2018-02-18 ENCOUNTER — Encounter: Payer: Self-pay | Admitting: Neurology

## 2018-02-18 ENCOUNTER — Ambulatory Visit: Payer: BLUE CROSS/BLUE SHIELD | Admitting: Neurology

## 2018-02-18 VITALS — BP 100/72 | HR 59 | Ht 64.0 in | Wt 150.0 lb

## 2018-02-18 DIAGNOSIS — G40219 Localization-related (focal) (partial) symptomatic epilepsy and epileptic syndromes with complex partial seizures, intractable, without status epilepticus: Secondary | ICD-10-CM

## 2018-02-18 DIAGNOSIS — G43709 Chronic migraine without aura, not intractable, without status migrainosus: Secondary | ICD-10-CM | POA: Diagnosis not present

## 2018-02-18 DIAGNOSIS — Q8502 Neurofibromatosis, type 2: Secondary | ICD-10-CM | POA: Diagnosis not present

## 2018-02-18 DIAGNOSIS — D429 Neoplasm of uncertain behavior of meninges, unspecified: Secondary | ICD-10-CM | POA: Diagnosis not present

## 2018-02-18 DIAGNOSIS — D420 Neoplasm of uncertain behavior of cerebral meninges: Secondary | ICD-10-CM

## 2018-02-18 DIAGNOSIS — D421 Neoplasm of uncertain behavior of spinal meninges: Secondary | ICD-10-CM

## 2018-02-18 NOTE — Progress Notes (Addendum)
Initiated via covermymeds 02/18/18.   Key [id number] It noted that it takes 5 business days for response because it is faxed to [facility].  Received fax 02/19/18 stating case was already opened and approved from 10/07/2015 through 10/05/2020.    02/20/2018 I called [phone], contract# [id number] option 2 then stated authorization then waited then stated representative and ultimately was able to speak to and and verified buy and bill was an option and it is. Call reference [phone] [id number]  Note for this insurance the [facility] is the one that takes care of these matters even if it is buy and bill.

## 2018-02-18 NOTE — Patient Instructions (Addendum)
1.  Continue Keppra 500mg  twice daily.   2.  Will obtain pre-authorization for Botox 3.  We will refer you to pain management.  In meantime, increase gabapentin to 200mg  three times daily. 4.  Follow up in early March for VNS check

## 2018-02-22 ENCOUNTER — Telehealth: Payer: Self-pay | Admitting: Neurology

## 2018-02-22 ENCOUNTER — Ambulatory Visit (INDEPENDENT_AMBULATORY_CARE_PROVIDER_SITE_OTHER): Payer: BLUE CROSS/BLUE SHIELD | Admitting: Neurology

## 2018-02-22 DIAGNOSIS — G43709 Chronic migraine without aura, not intractable, without status migrainosus: Secondary | ICD-10-CM

## 2018-02-22 DIAGNOSIS — D420 Neoplasm of uncertain behavior of cerebral meninges: Secondary | ICD-10-CM

## 2018-02-22 DIAGNOSIS — Q8502 Neurofibromatosis, type 2: Secondary | ICD-10-CM

## 2018-02-22 DIAGNOSIS — D429 Neoplasm of uncertain behavior of meninges, unspecified: Secondary | ICD-10-CM

## 2018-02-22 MED ORDER — ONABOTULINUMTOXINA 100 UNITS IJ SOLR
155.0000 [IU] | Freq: Once | INTRAMUSCULAR | Status: AC
  Administered 2018-02-22: 155 [IU] via INTRAMUSCULAR

## 2018-02-22 NOTE — Progress Notes (Signed)
[facility]   Procedure Note Botox  Attending: Dr. Branden Vine  Preoperative Diagnosis(es): Chronic migraine  Consent obtained from: The patient Benefits discussed included, but were not limited to decreased muscle tightness, increased joint range of motion, and decreased pain.  Risk discussed included, but were not limited pain and discomfort, bleeding, bruising, excessive weakness, venous thrombosis, muscle atrophy and dysphagia.  Anticipated outcomes of the procedure as well as he risks and benefits of the alternatives to the procedure, and the roles and tasks of the personnel to be involved, were discussed with the patient, and the patient consents to the procedure and agrees to proceed. A copy of the patient medication guide was given to the patient which explains the blackbox warning.  Patients identity and treatment sites confirmed Yes.  .  Details of Procedure: Skin was cleaned with alcohol. Prior to injection, the needle plunger was aspirated to make sure the needle was not within a blood vessel.  There was no blood retrieved on aspiration.    Following is a summary of the muscles injected  And the amount of Botulinum toxin used:  Dilution 200 units of Botox was reconstituted with 4 ml of preservative free normal saline. Time of reconstitution: At the time of the office visit (<30 minutes prior to injection)   Injections  155 total units of Botox was injected with a 30 gauge needle.  Injection Sites: L occipitalis: 15 units- 3 sites  R occiptalis: 15 units- 3 sites  L upper trapezius: 15 units- 3 sites R upper trapezius: 15 units- 3 sits          L paraspinal: 10 units- 2 sites R paraspinal: 10 units- 2 sites  Face L frontalis(2 injection sites):10 units   R frontalis(2 injection sites):10 units         L corrugator: 5 units   R corrugator: 5 units           Procerus: 5 units   L temporalis: 20 units R temporalis: 20 units   Agent:  200 units of botulinum Type A  (Onobotulinum Toxin type A) was reconstituted with 4 ml of preservative free normal saline.  Time of reconstitution: At the time of the office visit (<30 minutes prior to injection)     Total injected (Units): 155  Total wasted (Units): 45  Patient tolerated procedure well without complications.   Reinjection is anticipated in 3 months.   

## 2018-02-22 NOTE — Telephone Encounter (Signed)
Lisa Burnett wishes to have her MRIs performed here in [place] (they were originally ordered by [facility]).    She typically has MRI of the neuro-axis with and without contrast (brain/orbits/cervical/thoracic/lumbar spines) every 6 months.  She needs to have them performed under sedation.  Therefore, the imaging needs to be scheduled within 30 days from her last appointment with me (02/18/18).  She will need to come to our office prior to the test to have her VNS turned off.  She is to return to our office afterwards to have it turned back on.

## 2018-03-01 NOTE — Addendum Note (Signed)
Addended by: Clois Comber on: 03/01/2018 04:20 PM   Modules accepted: Orders

## 2018-03-01 NOTE — Telephone Encounter (Signed)
Called centralized scheduling, was transferred to [place], had to [facility], left Pt's info asking for rtrn call on 12/30 Monday to get                       MRI's with sedation scheduled

## 2018-03-05 ENCOUNTER — Telehealth: Payer: Self-pay

## 2018-03-05 NOTE — Telephone Encounter (Signed)
Rcvd VM from [place] in centralized scheduling. The first available time to schedule all the MRI's is 03/26/18, which will be 5 days outside the 30 days required to have had the Pt seen in the office to clear for sedation. Called Pt, I asked if she could schedule with her PCP. Pt called back, LM on my VM stating mad an appt with her PCP on 03/11/18 at 8:15am Dr. Caren Macadam at [place]. I called Vivien Rota at centralized scheduling, had to again [facility], I asked she contact Pt directly to go ahead and schedule for 1/21 and to get the info to send form to Dr Mannie Stabile at [place] to clear for sedation.

## 2018-03-05 NOTE — Telephone Encounter (Signed)
Rcvd call from [place]-she states she rcvd a call from one of the MRI techs and was told Pt will require a CXR prior to T-spine, per Dr. Romelle Starcher at [facility]. Should Dr. Tomi Likens have any questions, Dr. Romelle Starcher can be reached at [phone].  Dr. Tomi Likens is considering if the Pt should continue at [facility] or another academic hsp. [facility] has a neurofibromistosis department. Will determine after the New Year.

## 2018-03-11 DIAGNOSIS — J019 Acute sinusitis, unspecified: Secondary | ICD-10-CM | POA: Diagnosis not present

## 2018-03-11 DIAGNOSIS — Q8500 Neurofibromatosis, unspecified: Secondary | ICD-10-CM | POA: Diagnosis not present

## 2018-03-11 DIAGNOSIS — Z01818 Encounter for other preprocedural examination: Secondary | ICD-10-CM | POA: Diagnosis not present

## 2018-03-12 ENCOUNTER — Telehealth: Payer: Self-pay | Admitting: Neurology

## 2018-03-12 DIAGNOSIS — D429 Neoplasm of uncertain behavior of meninges, unspecified: Secondary | ICD-10-CM

## 2018-03-12 DIAGNOSIS — D420 Neoplasm of uncertain behavior of cerebral meninges: Secondary | ICD-10-CM

## 2018-03-12 NOTE — Telephone Encounter (Signed)
Patient left vm about speaking with you in regards to MRI at [place]. Please call her back at [phone]. Thanks!

## 2018-03-14 DIAGNOSIS — D320 Benign neoplasm of cerebral meninges: Secondary | ICD-10-CM | POA: Insufficient documentation

## 2018-03-15 NOTE — Telephone Encounter (Signed)
Patient called needing a VNS appointment as well as needing a Pain Management Referral. Please Call. Thanks

## 2018-03-15 NOTE — Telephone Encounter (Signed)
Called and spoke with Pt. She was contacted by [facility] concerning the VNS. She decided to have the MRI's at [facility].   She is asking for an appointment for someone to check her VNS here in our office as well as a referral to pain management.

## 2018-03-15 NOTE — Telephone Encounter (Signed)
Per Dr. Georgie Chard notes, VNS doesn't need to be checked until March.  Will leave pain management referral to him when he gets back

## 2018-03-18 NOTE — Telephone Encounter (Signed)
Called and LMOVM advising Pt

## 2018-04-01 NOTE — Telephone Encounter (Signed)
Patient is calling in about the pain management referral. Please call her back at [phone]. Thanks!

## 2018-04-01 NOTE — Telephone Encounter (Signed)
Called and advised Pt am referring to [facility]

## 2018-04-01 NOTE — Telephone Encounter (Signed)
I have no preference

## 2018-04-01 NOTE — Addendum Note (Signed)
Addended by: Clois Comber on: 04/01/2018 03:56 PM   Modules accepted: Orders

## 2018-04-08 ENCOUNTER — Telehealth: Payer: Self-pay | Admitting: Neurology

## 2018-04-08 NOTE — Telephone Encounter (Signed)
Will call [facility] tomorrow. It can take 2 weeks before they contact Pt's

## 2018-04-08 NOTE — Telephone Encounter (Signed)
Patient is calling about the pain management referral that was supposed to be sent. She has not heard anything, Please call her back. Thanks!

## 2018-04-09 NOTE — Telephone Encounter (Signed)
Called [facility], spoke with [place]. I asked the status of the referral since the Pt has not been contacted. She said she will make sure the referral coordinator is aware of this, I asked that they contact the Pt directly, she said it will be within the next 2 days. I called the Pt and advised her. She will call us back if she has not heard from them by Friday.

## 2018-04-14 DIAGNOSIS — J019 Acute sinusitis, unspecified: Secondary | ICD-10-CM | POA: Diagnosis not present

## 2018-04-29 ENCOUNTER — Encounter: Payer: Self-pay | Admitting: Physical Medicine & Rehabilitation

## 2018-05-03 ENCOUNTER — Ambulatory Visit: Payer: BLUE CROSS/BLUE SHIELD | Admitting: Neurology

## 2018-05-06 DIAGNOSIS — E039 Hypothyroidism, unspecified: Secondary | ICD-10-CM | POA: Diagnosis not present

## 2018-05-06 DIAGNOSIS — E785 Hyperlipidemia, unspecified: Secondary | ICD-10-CM | POA: Diagnosis not present

## 2018-05-06 DIAGNOSIS — Y842 Radiological procedure and radiotherapy as the cause of abnormal reaction of the patient, or of later complication, without mention of misadventure at the time of the procedure: Secondary | ICD-10-CM | POA: Diagnosis not present

## 2018-05-06 DIAGNOSIS — G8928 Other chronic postprocedural pain: Secondary | ICD-10-CM | POA: Diagnosis not present

## 2018-05-09 NOTE — Progress Notes (Signed)
NEUROLOGY FOLLOW UP OFFICE NOTE  Lisa Burnett [id number]  HISTORY OF PRESENT ILLNESS: Lisa Burnett is a 58 year old Burnett-handed female with neurofibromatosis type 2 and spinal meningioma who follows up for seizure disorder and headaches.  History supplemented by notes from PCP, prior neurologist and neuro-ophthalmology.  UPDATE: She states the Botox was ineffective.   She couldn't get her MRI here at [facility] due to having a coil.  She needs to have it done at [facility].    Current NSAIDS:  none Current analgesics:  Oxycodone (daily for headache and neuralgia) Current triptans:  none Current ergotamine:  none Current anti-emetic:  none Current muscle relaxants:  none Current anti-anxiolytic:  Lorazepam 1mg  daily Current sleep aide:  none Current Antihypertensive medications:  Clonidine, triamterene/HCTZ Current Antidepressant medications:  none Current Anticonvulsant medications:  Gabapentin 100mg  TID Current anti-CGRP:  none Current Vitamins/Herbal/Supplements:  Vitamin D, magnesium 400mg  Current Antihistamines/Decongestants:  none Other therapy:  Botox  Caffeine: 2 cups of coffee per day Exercise: Light Depression:  no; Anxiety:  no Other pain:  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  She underwent resection of Burnett cavernous sinus meningioma in 5885 complicated by Burnett frontal hemorrhage.  She has had epilepsy since the resection.  She subsequently was treated with CyberKnife radiation in 2015.  Current seizures present as tonic-clonic activity with loss of consciousness.  She has postictal confusion for up to 2 hours.  They last 3 to 5 minutes brief twitching of Burnett sided facial usually.  They occur once every 3 weeks.  After a seizure, she will usually take an extra Keppra, Ativan and potassium.  She also has smaller partial seizures presenting as facial twitching with loss of awareness lasting a few seconds.  She currently  takes Keppra 500mg  twice daily and gabapentin 100mg  TID (for neuralgia and PMS).  She had VNS implanted on 01/20/2014.  Prior AEDs included lamotrigine, Lyrica, diazepam, Tegretol, Trileptal, Topiramate, Depakote.  She does not drive.    VNS Settings from 10/31/17: Normal: Output current:  0.5 mA Frequency: 15 Hz PWW: 250 On-TIme: 30 sec Off-Time: 3 minutes Duty Cycle:  16^  Magnet Sttings: Output Current: 1.0 mA PW: 250 On-Time: 60  Auto stim settings Auto-Stim: .875 mA PW: 250 Auto Stim On: 30 sec  Lead Impedence: ok, 4133 ohms IFI: NO Battery: OK (75-100%)  She also developed Burnett CN III, IV, V (V1-V2) palsies following Cyperknife radiation in 2015.  She has routine follow up with neuro-ophthalmology.  She wears a patch over her Burnett eye.    She underwent L3/L4 laminectomy for tumor resection in 1995.  She also underwent spinal meningioma resection of the thoracic region in 2012 which was causing myelopathy.  She has residual chronic pain and notes numbness and tightness in Burnett thigh.  She was evaluated by neuro-ophthalmology on 02/14/18 and was found to have normal optic nerve and OCT nerve exam and full visual fields.  MRI orbits with and without contrast from 10/11/17 showed postsurgical findings related to prior Burnett temporal craniotomy, Burnett cavernous sinus meningioma involving Burnett superior and inferior orbital fissures, Burnett suprasellar cistern, and Burnett Meckel's cave and minimally along the prepontine cistern, Burnett anterior temporal convexity and Burnett tentorial leaflet.  Encasement of Burnett cavernous ICA with mildly narrowed flow void.  Findings unchanged compared to prior imaging from 06/28/17.  MRI of cervical/thoracic/lumbar spine from 06/29/17 with and without contrast showed stable degenerative disc disease of the cervical and lumbar  spine with no contrast-enhancing mass lesions or myelopathy,.  She has migraines since meningioma resection in 2014.   They are severe bilateral periorbital pressure that is daily and lasts several hours.  She has associated nausea, photophobia, phonophobia and bilateral (Burnett worse than left) paresthesias.  She also has TMJ dysfunction.  She also reports left occipital paresthesias, diagnosed with occipital neuralgia treated with nerve block.  Past NSAIDS:  Ibuprofen, naproxen, Cambia Past analgesics:  Tramadol, Norco, Fentanyl patch Past abortive triptans:  Imitrex, Maxalt, Amerge, Relpax Past abortive ergotamine:  none Past muscle relaxants:  none Past anti-emetic:  none Past antihypertensive medications:  lisinopril Past antidepressant medications:  amitriptyline Past anticonvulsant medications:  Topiramate, lamotrigine, Lyrica Past anti-CGRP:  none Past vitamins/Herbal/Supplements:  none Past antihistamines/decongestants:  none Other past therapies:  none  Family history:  Mother (migraines), cousin (seizure disorder  PAST MEDICAL HISTORY: Neurofibromatosis 2 Seizure disorder Migraines Hypothyroidism  MEDICATIONS: Current Outpatient Medications on File Prior to Visit  Medication Sig Dispense Refill  . Cholecalciferol (VITAMIN D3) 50 MCG (2000 UT) capsule Take 5,000 Units by mouth daily.     . cloNIDine (CATAPRES) 0.2 MG tablet Take by mouth.    . gabapentin (NEURONTIN) 100 MG capsule Take by mouth.    . levothyroxine (SYNTHROID) 112 MCG tablet Take by mouth.    Marland Kitchen LORazepam (ATIVAN) 1 MG tablet Take by mouth.    . triamterene-hydrochlorothiazide (DYAZIDE) 37.5-25 MG capsule Take by mouth.     No current facility-administered medications on file prior to visit.     ALLERGIES: Allergies  Allergen Reactions  . Clonazepam Rash    [name]  . Hydroxychloroquine     Other reaction(s): Other (See Comments) Lisa Burnett  . Penicillins Anaphylaxis and Hives    Other reaction(s): Other, Other (See Comments)  . Latex Rash    Latex questionnaire sent to or 12/26/12   . Sulfa  Antibiotics     Other reaction(s): Other  . Tape Rash    With blisters With blisters     FAMILY HISTORY: Family History  Problem Relation Age of Onset  . Hypertension Mother   . Hypertension Father   . Thyroid disease Sister   . Hypertension Sister    SOCIAL HISTORY: Social History   Socioeconomic History  . Marital status: Married    Spouse name: Clair Gulling  . Number of children: 2  . Years of education: Not on file  . Highest education level: Associate degree: occupational, Hotel manager, or vocational program  Occupational History  . Occupation: disabled  Social Needs  . Financial resource strain: Not on file  . Food insecurity:    Worry: Not on file    Inability: Not on file  . Transportation needs:    Medical: Not on file    Non-medical: Not on file  Tobacco Use  . Smoking status: Never Smoker  . Smokeless tobacco: Never Used  Substance and Sexual Activity  . Alcohol use: Yes    Comment: socially, cocktails  . Drug use: Not on file  . Sexual activity: Not on file  Lifestyle  . Physical activity:    Days per week: Not on file    Minutes per session: Not on file  . Stress: Not on file  Relationships  . Social connections:    Talks on phone: Not on file    Gets together: Not on file    Attends religious service: Not on file    Active member of club or organization: Not on file  Attends meetings of clubs or organizations: Not on file    Relationship status: Not on file  . Intimate partner violence:    Fear of current or ex partner: Not on file    Emotionally abused: Not on file    Physically abused: Not on file    Forced sexual activity: Not on file  Other Topics Concern  . Not on file  Social History Narrative   Patient is righ-handed. She lives with her husband in a 2 level home. She drinks 4 cups of coffee a day, and an occasional cup of tea.    REVIEW OF SYSTEMS: Constitutional: No fevers, chills, or sweats, no generalized fatigue, change in  appetite Eyes: No visual changes, double vision, eye pain Ear, nose and throat: No hearing loss, ear pain, nasal congestion, sore throat Cardiovascular: No chest pain, palpitations Respiratory:  No shortness of breath at rest or with exertion, wheezes GastrointestinaI: No nausea, vomiting, diarrhea, abdominal pain, fecal incontinence Genitourinary:  No dysuria, urinary retention or frequency Musculoskeletal:  No neck pain, back pain Integumentary: No rash, pruritus, skin lesions Neurological: as above Psychiatric: No depression, insomnia, anxiety Endocrine: No palpitations, fatigue, diaphoresis, mood swings, change in appetite, change in weight, increased thirst Hematologic/Lymphatic:  No purpura, petechiae. Allergic/Immunologic: no itchy/runny eyes, nasal congestion, recent allergic reactions, rashes  PHYSICAL EXAM: Blood pressure 112/78, pulse 65, height 5\' 4"  (1.626 m), weight 147 lb (66.7 kg), SpO2 98 %. General: No acute distress.  Patient appears well-groomed.   Head:  Normocephalic/atraumatic Eyes:  Fundi examined but not visualized Neck: supple, no paraspinal tenderness, full range of motion Heart:  Regular rate and rhythm Lungs:  Clear to auscultation bilaterally Back: No paraspinal tenderness Neurological Exam: alert and oriented to person, place, and time. Attention span and concentration intact, recent and remote memory intact, fund of knowledge intact.  Speech fluent and not dysarthric, language intact.  Burnett CN III and IV palsies.  Reduced Burnett V1-V2.  Otherwise, CN II-XII intact. Bulk and tone normal, muscle strength 5/5 throughout.  Sensation to light touch, temperature and vibration intact.  Deep tendon reflexes 2+ throughout, toes downgoing.  Finger to nose and heel to shin testing intact.  Gait normal, Romberg negative.  IMPRESSION: 1.  Symptomatic localization-related epilepsy 2.  Neurofibromatosis 2 3.  Burnett cavernous sinus meningioma 4.  Chronic migraine without  aura, without status migrainosus, not intractable 5.  Multiple meningiomas  Due to the complexity of her medical history, I think she would more likely benefit from having her care managed at [facility] where she will have a multispecialty team working together to care for her.  [facility] has an NF clinic and she can be followed by a neurologist/epileptologist as well as ophthalmology.  They also have the appropriate MRI that is safe to accommodate her coil.    PLAN: VNS checked today We will initiate referral to [facility] neurology and NF clinic Keppra 500mg  twice daily While waiting to establish care, I will refill her medication and provide Botox  Metta Clines, DO  CC: Caren Macadam, MD

## 2018-05-10 ENCOUNTER — Other Ambulatory Visit: Payer: Self-pay

## 2018-05-10 ENCOUNTER — Ambulatory Visit: Payer: BLUE CROSS/BLUE SHIELD | Admitting: Neurology

## 2018-05-10 ENCOUNTER — Encounter: Payer: Self-pay | Admitting: Neurology

## 2018-05-10 VITALS — BP 112/78 | HR 65 | Ht 64.0 in | Wt 147.0 lb

## 2018-05-10 DIAGNOSIS — Q8502 Neurofibromatosis, type 2: Secondary | ICD-10-CM

## 2018-05-10 DIAGNOSIS — D420 Neoplasm of uncertain behavior of cerebral meninges: Secondary | ICD-10-CM

## 2018-05-10 DIAGNOSIS — G40219 Localization-related (focal) (partial) symptomatic epilepsy and epileptic syndromes with complex partial seizures, intractable, without status epilepticus: Secondary | ICD-10-CM

## 2018-05-10 DIAGNOSIS — D429 Neoplasm of uncertain behavior of meninges, unspecified: Secondary | ICD-10-CM

## 2018-05-10 DIAGNOSIS — G43709 Chronic migraine without aura, not intractable, without status migrainosus: Secondary | ICD-10-CM

## 2018-05-10 NOTE — Patient Instructions (Signed)
1.  Continue Keppra 2.  Will formally refer you to [facility]:  NF clinic, epileptologist, etc

## 2018-05-14 ENCOUNTER — Telehealth: Payer: Self-pay | Admitting: Neurology

## 2018-05-14 NOTE — Telephone Encounter (Signed)
Patient was seen by [facility] Neurologist Dr. Forest Gleason already, most recent visit on March 6th 2020, he referred patient out to [facility] neurology and NF clinic.  I do not think we need to see this patient here.

## 2018-05-14 NOTE — Telephone Encounter (Signed)
Noted. I've left a message for the patient and also informed referring office of this.

## 2018-05-17 DIAGNOSIS — D321 Benign neoplasm of spinal meninges: Secondary | ICD-10-CM | POA: Diagnosis not present

## 2018-05-17 DIAGNOSIS — G527 Disorders of multiple cranial nerves: Secondary | ICD-10-CM | POA: Diagnosis not present

## 2018-05-17 DIAGNOSIS — Q8502 Neurofibromatosis, type 2: Secondary | ICD-10-CM | POA: Diagnosis not present

## 2018-05-17 DIAGNOSIS — H499 Unspecified paralytic strabismus: Secondary | ICD-10-CM | POA: Diagnosis not present

## 2018-05-17 DIAGNOSIS — D329 Benign neoplasm of meninges, unspecified: Secondary | ICD-10-CM | POA: Diagnosis not present

## 2018-05-21 DIAGNOSIS — M5030 Other cervical disc degeneration, unspecified cervical region: Secondary | ICD-10-CM | POA: Diagnosis not present

## 2018-05-21 DIAGNOSIS — M5136 Other intervertebral disc degeneration, lumbar region: Secondary | ICD-10-CM | POA: Diagnosis not present

## 2018-05-24 ENCOUNTER — Ambulatory Visit (INDEPENDENT_AMBULATORY_CARE_PROVIDER_SITE_OTHER): Payer: BLUE CROSS/BLUE SHIELD | Admitting: Neurology

## 2018-05-24 ENCOUNTER — Other Ambulatory Visit: Payer: Self-pay

## 2018-05-24 DIAGNOSIS — G43709 Chronic migraine without aura, not intractable, without status migrainosus: Secondary | ICD-10-CM

## 2018-05-24 MED ORDER — ONABOTULINUMTOXINA 100 UNITS IJ SOLR
200.0000 [IU] | Freq: Once | INTRAMUSCULAR | Status: AC
  Administered 2018-05-24: 178 [IU] via INTRAMUSCULAR

## 2018-05-24 NOTE — Progress Notes (Signed)
Botulinum Clinic   Procedure Note Botox  Attending: Dr. Tomi Likens  Preoperative Diagnosis(es): Chronic migraine  Consent obtained from: Patient Benefits discussed included, but were not limited to decreased muscle tightness, increased joint range of motion, and decreased pain.  Risk discussed included, but were not limited pain and discomfort, bleeding, bruising, excessive weakness, venous thrombosis, muscle atrophy and dysphagia.  Anticipated outcomes of the procedure as well as he risks and benefits of the alternatives to the procedure, and the roles and tasks of the personnel to be involved, were discussed with the patient, and the patient consents to the procedure and agrees to proceed. A copy of the patient medication guide was given to the patient which explains the blackbox warning.  Patients identity and treatment sites confirmed:  Yes  Details of Procedure: Skin was cleaned with alcohol. Prior to injection, the needle plunger was aspirated to make sure the needle was not within a blood vessel.  There was no blood retrieved on aspiration.    Following is a summary of the muscles injected  And the amount of Botulinum toxin used:  Dilution 200 units of Botox was reconstituted with 4 ml of preservative free normal saline. Time of reconstitution: At the time of the office visit (<30 minutes prior to injection)   Injections  155 total units of Botox was injected with a 30 gauge needle.  Injection Sites: L occipitalis: 15 units- 3 sites  R occiptalis: 15 units- 3 sites  L upper trapezius: 15 units- 3 sites R upper trapezius: 15 units- 3 sits          L paraspinal: 10 units- 2 sites R paraspinal: 10 units- 2 sites  Face L frontalis(2 injection sites):10 units   R frontalis(2 injection sites):10 units         L corrugator: 5 units   R corrugator: 5 units           Procerus: 7.5 units   L temporalis: 30 units R temporalis: 30 units   Agent:  200 units of botulinum Type A  (Onobotulinum Toxin type A) was reconstituted with 4 ml of preservative free normal saline.  Time of reconstitution: At the time of the office visit (<30 minutes prior to injection)     Total injected (Units): 177.5  Total wasted (Units): 22.5  Patient tolerated procedure well without complications.   Reinjection is anticipated in 3 months with her new neurologist.

## 2018-05-31 DIAGNOSIS — Q8502 Neurofibromatosis, type 2: Secondary | ICD-10-CM | POA: Diagnosis not present

## 2018-05-31 DIAGNOSIS — R682 Dry mouth, unspecified: Secondary | ICD-10-CM | POA: Diagnosis not present

## 2018-05-31 DIAGNOSIS — K115 Sialolithiasis: Secondary | ICD-10-CM | POA: Diagnosis not present

## 2018-06-03 ENCOUNTER — Ambulatory Visit: Payer: BLUE CROSS/BLUE SHIELD | Admitting: Physical Medicine & Rehabilitation

## 2018-06-07 ENCOUNTER — Encounter: Payer: Self-pay | Admitting: Physical Medicine & Rehabilitation

## 2018-06-07 ENCOUNTER — Encounter
Payer: BLUE CROSS/BLUE SHIELD | Attending: Physical Medicine & Rehabilitation | Admitting: Physical Medicine & Rehabilitation

## 2018-06-07 ENCOUNTER — Other Ambulatory Visit: Payer: Self-pay

## 2018-06-07 VITALS — BP 110/71 | HR 58 | Temp 97.9°F | Ht 63.5 in | Wt 148.0 lb

## 2018-06-07 DIAGNOSIS — Z981 Arthrodesis status: Secondary | ICD-10-CM | POA: Diagnosis not present

## 2018-06-07 DIAGNOSIS — Z5181 Encounter for therapeutic drug level monitoring: Secondary | ICD-10-CM | POA: Diagnosis not present

## 2018-06-07 DIAGNOSIS — Z79891 Long term (current) use of opiate analgesic: Secondary | ICD-10-CM | POA: Insufficient documentation

## 2018-06-07 DIAGNOSIS — G894 Chronic pain syndrome: Secondary | ICD-10-CM | POA: Diagnosis not present

## 2018-06-07 DIAGNOSIS — G479 Sleep disorder, unspecified: Secondary | ICD-10-CM

## 2018-06-07 DIAGNOSIS — S43422A Sprain of left rotator cuff capsule, initial encounter: Secondary | ICD-10-CM | POA: Insufficient documentation

## 2018-06-07 DIAGNOSIS — R269 Unspecified abnormalities of gait and mobility: Secondary | ICD-10-CM | POA: Diagnosis not present

## 2018-06-07 DIAGNOSIS — G43109 Migraine with aura, not intractable, without status migrainosus: Secondary | ICD-10-CM | POA: Diagnosis not present

## 2018-06-07 DIAGNOSIS — Q8502 Neurofibromatosis, type 2: Secondary | ICD-10-CM | POA: Diagnosis not present

## 2018-06-07 DIAGNOSIS — D320 Benign neoplasm of cerebral meninges: Secondary | ICD-10-CM | POA: Insufficient documentation

## 2018-06-07 DIAGNOSIS — H4901 Third [oculomotor] nerve palsy, right eye: Secondary | ICD-10-CM | POA: Diagnosis not present

## 2018-06-07 MED ORDER — DICLOFENAC SODIUM 1 % TD GEL: 2.0000 g | Freq: Four times a day (QID) | TRANSDERMAL | 1 refills | Status: DC

## 2018-06-07 MED ORDER — DULOXETINE HCL 30 MG PO CPEP: 30.0000 mg | ORAL_CAPSULE | Freq: Every day | ORAL | 1 refills | Status: DC

## 2018-06-07 MED ORDER — BACLOFEN 10 MG PO TABS: 10.0000 mg | ORAL_TABLET | Freq: Three times a day (TID) | ORAL | 1 refills | Status: DC

## 2018-06-07 MED ORDER — LIDOCAINE 5 % EX PTCH: 1.0000 | MEDICATED_PATCH | CUTANEOUS | 1 refills | Status: DC

## 2018-06-07 NOTE — Progress Notes (Addendum)
Subjective:    Patient ID: Lisa Burnett, female    DOB: 1960/07/30, 58 y.o.   MRN: [id number]  HPI 58 y/o female with pmh/psh Neurofibromatosis 2, SCI (s/p ?T7-10 fusion) migraines, meningiomas, rotator cuff tear, hypothyroidism, generalized OA, fibromyalgia, kidney stones, back surgery for removal of meningioma, vagus nerve stimulator, brain surgery with radiation and with complications presents with pain.  Husband supplements history. Started after back surgery in 2012. Stable.  She was getting Fentanyl patches in [place].  Movement and station positions exacerbate the pain.  Medications improve the pain.  Offloading improves the pain.  Stabbing with varying tempuratures of pain.  Radiates to dorsal feet, R>L. She tried therapies with benefit.  Using cane for ambulation, multiple falls. Pain limits all activities. She was seen by Neurology and referred to [facility] for all of her care.  Plan is to follow up with [facility] for all care eventually.   Pain Inventory Average Pain 7 Pain Right Now 8 My pain is intermittent, constant, sharp, burning, dull, stabbing, tingling and aching  In the last 24 hours, has pain interfered with the following? General activity 10 Relation with others 10 Enjoyment of life 10 What TIME of day is your pain at its worst? morning and night Sleep (in general) Poor  Pain is worse with: walking, bending, sitting, inactivity, standing and some activites Pain improves with: rest, medication and injections Relief from Meds: 5  Mobility walk with assistance use a cane how many minutes can you walk? 10 ability to climb steps?  yes do you drive?  no  Function disabled: date disabled na I need assistance with the following:  dressing, bathing, meal prep, household duties and shopping Do you have any goals in this area?  yes  Neuro/Psych weakness numbness tremor tingling trouble walking spasms dizziness confusion depression anxiety loss of taste or smell  Prior  Studies Any changes since last visit?  no bone scan x-rays CT/MRI nerve study  Physicians involved in your care Primary care Dr. Greta Doom Neurologist Dr. Edsel Petrin Psychiatrist Dr. Glennon Mac Rheumatologist Dr. Vickey Sages Neurosurgeon Dr. Algis Greenhouse Dr. Ron Parker, Dr. Leodis Liverpool, Dr. Tomi Likens   Family History  Problem Relation Age of Onset  . Hypertension Mother   . Hypertension Father   . Thyroid disease Sister   . Hypertension Sister    Social History   Socioeconomic History  . Marital status: Married    Spouse name: Clair Gulling  . Number of children: 2  . Years of education: Not on file  . Highest education level: Associate degree: occupational, Hotel manager, or vocational program  Occupational History  . Occupation: disabled  Social Needs  . Financial resource strain: Not on file  . Food insecurity:    Worry: Not on file    Inability: Not on file  . Transportation needs:    Medical: Not on file    Non-medical: Not on file  Tobacco Use  . Smoking status: Never Smoker  . Smokeless tobacco: Never Used  Substance and Sexual Activity  . Alcohol use: Yes    Comment: socially, cocktails  . Drug use: Not on file  . Sexual activity: Not on file  Lifestyle  . Physical activity:    Days per week: Not on file    Minutes per session: Not on file  . Stress: Not on file  Relationships  . Social connections:    Talks on phone: Not on file    Gets together: Not on file    Attends religious service: Not on file  Active member of club or organization: Not on file    Attends meetings of clubs or organizations: Not on file    Relationship status: Not on file  Other Topics Concern  . Not on file  Social History Narrative   Patient is righ-handed. She lives with her husband in a 2 level home. She drinks 4 cups of coffee a day, and an occasional cup of tea.   Past Surgical History:  Procedure Laterality Date  . BACK SURGERY    . BRAIN SURGERY    . SHOULDER SURGERY Left   . WRIST ARTHROPLASTY Left     Past Medical History:  Diagnosis Date  . Fibromyalgia   . Generalized arthritis   . Hashimoto's disease   . Kidney stones   . Meningioma, recurrent of brain ([place])   . Migraines   . Neurofibromatosis 2 (HCC)    BP 110/71   Pulse (!) 58   Temp 97.9 F (36.6 C)   Ht 5' 3.5" (1.613 m)   Wt 148 lb (67.1 kg)   SpO2 94%   BMI 25.81 kg/m   Opioid Risk Score:   Fall Risk Score:  `1  Depression screen PHQ 2/9  Depression screen PHQ 2/9 06/07/2018  Decreased Interest 1  Down, Depressed, Hopeless 1  PHQ - 2 Score 2  Altered sleeping 3  Tired, decreased energy 3  Change in appetite 1  Feeling bad or failure about yourself  1  Trouble concentrating 1  Moving slowly or fidgety/restless 0  Suicidal thoughts 0  PHQ-9 Score 11  Difficult doing work/chores Very difficult    Review of Systems  Constitutional: Positive for chills, diaphoresis and fever.  HENT: Negative.   Eyes: Positive for visual disturbance.  Respiratory: Positive for shortness of breath.   Cardiovascular: Negative.   Gastrointestinal: Positive for constipation.  Endocrine: Negative.   Genitourinary: Negative.   Musculoskeletal: Positive for arthralgias, back pain, gait problem, myalgias, neck pain and neck stiffness.  Skin: Negative.   Allergic/Immunologic: Negative.   Neurological: Positive for dizziness, seizures, weakness and numbness.  Hematological: Negative.   Psychiatric/Behavioral: Negative.   All other systems reviewed and are negative.     Objective:   Physical Exam Gen: NAD. Vital signs reviewed HENT: Normocephalic, Atraumatic Eyes: Right CN III palsy. No discharge.  Cardio: RRR. No JVD. Pulm: B/l clear to auscultation.  Effort normal Abd: Nondistended, BS+ MSK:  Gait antalgic with right leg exaggerated swing phase  No edema.  Neuro:  Sensation intact to light touch in all UE and LE dermatomes  Strength  4+/5 in all LUE/RLE myotomes, except for proximal shoulder    4-/5 in all RUE/LLE  myotomes Skin: Warm and Dry. Intact    Assessment & Plan:  58 y/o female with pmh/psh Neurofibromatosis 2 (s/p ?T7-10 fusion), migraines, meningiomas, hypothyroidism, rotator cuff tear, generalized OA, fibromyalgia, kidney stones, back surgery for removal of meningioma, vagus nerve stimulator, brain surgery with radiation and with complications presents with pain  1. Chronic generalized pain - Multifactorial - see above  MRI report reviewed, limited, but showing multiple meningiomas  Labs performed by PCP - encouraged to bring in  Referral information reviewed  PMAWARE reviewed  No benefit with Cold, Elavil  Will order urine drug swab  Unable to tolerate TNS due to multiple issues with hardward, VNS  Cont limited heat  Cont HEP  Taking Gabapentin 100 in AM, 200 qhs due to awkward sleep schedule- states this works for her  Will order Lidoderm  patches  Will order Cymbalta 30mg  daily with food  Will order Baclofen 5 BID PRN  Will order Voltaren gel for shoulders  Will consider Robaxin   Will order referral to [facility] after CoVid-19  Patient states main goal is improve dizziness  Not interested in referral to Psychology - states she has good coping mechanisms   2. Gait abnormality  Cont Cane  3. Sleep disturbance  See #1  Will consider Pamelor

## 2018-06-07 NOTE — Addendum Note (Signed)
Addended by: Jasmine December T on: 06/07/2018 03:34 PM   Modules accepted: Orders

## 2018-06-10 ENCOUNTER — Telehealth: Payer: Self-pay | Admitting: Physical Medicine & Rehabilitation

## 2018-06-10 LAB — DRUG TOX ALC METAB W/CON, ORAL FLD: Alcohol Metabolite: NEGATIVE ng/mL (ref <25)

## 2018-06-10 LAB — DRUG TOX MONITOR 1 W/CONF, ORAL FLD

## 2018-06-10 NOTE — Telephone Encounter (Signed)
Please change prescription to 90 tablets.  Thank you.

## 2018-06-10 NOTE — Telephone Encounter (Signed)
Patient needs to get clarification on Baclofen, she was told to take it 3 x day, but pharmacy only gave her 30 pills which would make it 1 tab a day.  Please call.

## 2018-06-11 ENCOUNTER — Telehealth: Payer: Self-pay | Admitting: *Deleted

## 2018-06-11 DIAGNOSIS — G894 Chronic pain syndrome: Secondary | ICD-10-CM | POA: Diagnosis not present

## 2018-06-11 DIAGNOSIS — Z79891 Long term (current) use of opiate analgesic: Secondary | ICD-10-CM

## 2018-06-11 DIAGNOSIS — Z5181 Encounter for therapeutic drug level monitoring: Secondary | ICD-10-CM

## 2018-06-11 MED ORDER — BACLOFEN 10 MG PO TABS: 10.0000 mg | ORAL_TABLET | Freq: Three times a day (TID) | ORAL | 1 refills | Status: DC

## 2018-06-11 NOTE — Telephone Encounter (Signed)
Oral swab was insufficient quantity so I have called Lisa Burnett to let her know we need a UDS and it has to be today. She will go to [facility] and give a specimen. Orders placed.

## 2018-06-11 NOTE — Telephone Encounter (Signed)
done

## 2018-06-11 NOTE — Addendum Note (Signed)
Addended by: Geryl Rankins D on: 06/11/2018 08:20 AM   Modules accepted: Orders

## 2018-06-12 DIAGNOSIS — D321 Benign neoplasm of spinal meninges: Secondary | ICD-10-CM | POA: Insufficient documentation

## 2018-06-12 DIAGNOSIS — G43709 Chronic migraine without aura, not intractable, without status migrainosus: Secondary | ICD-10-CM | POA: Insufficient documentation

## 2018-06-13 DIAGNOSIS — Q8502 Neurofibromatosis, type 2: Secondary | ICD-10-CM | POA: Diagnosis not present

## 2018-06-13 DIAGNOSIS — D321 Benign neoplasm of spinal meninges: Secondary | ICD-10-CM | POA: Diagnosis not present

## 2018-06-13 DIAGNOSIS — D320 Benign neoplasm of cerebral meninges: Secondary | ICD-10-CM | POA: Diagnosis not present

## 2018-06-14 LAB — TOXASSURE SELECT,+ANTIDEPR,UR

## 2018-06-17 ENCOUNTER — Telehealth: Payer: Self-pay | Admitting: *Deleted

## 2018-06-17 NOTE — Telephone Encounter (Signed)
Urine drug screen was consistent for having no medication.

## 2018-06-25 DIAGNOSIS — Q8502 Neurofibromatosis, type 2: Secondary | ICD-10-CM | POA: Diagnosis not present

## 2018-06-27 DIAGNOSIS — D329 Benign neoplasm of meninges, unspecified: Secondary | ICD-10-CM | POA: Diagnosis not present

## 2018-07-01 DIAGNOSIS — Q8502 Neurofibromatosis, type 2: Secondary | ICD-10-CM | POA: Diagnosis not present

## 2018-07-01 DIAGNOSIS — G40211 Localization-related (focal) (partial) symptomatic epilepsy and epileptic syndromes with complex partial seizures, intractable, with status epilepticus: Secondary | ICD-10-CM | POA: Diagnosis not present

## 2018-07-05 DIAGNOSIS — Q8502 Neurofibromatosis, type 2: Secondary | ICD-10-CM | POA: Diagnosis not present

## 2018-07-06 ENCOUNTER — Encounter: Payer: Self-pay | Admitting: Neurology

## 2018-07-08 ENCOUNTER — Encounter: Payer: BLUE CROSS/BLUE SHIELD | Admitting: Physical Medicine & Rehabilitation

## 2018-07-12 ENCOUNTER — Encounter: Payer: Self-pay | Admitting: Neurology

## 2018-07-12 ENCOUNTER — Other Ambulatory Visit: Payer: Self-pay | Admitting: Family Medicine

## 2018-07-12 DIAGNOSIS — R1011 Right upper quadrant pain: Secondary | ICD-10-CM | POA: Diagnosis not present

## 2018-07-12 DIAGNOSIS — E039 Hypothyroidism, unspecified: Secondary | ICD-10-CM | POA: Diagnosis not present

## 2018-07-12 DIAGNOSIS — L989 Disorder of the skin and subcutaneous tissue, unspecified: Secondary | ICD-10-CM | POA: Diagnosis not present

## 2018-07-12 DIAGNOSIS — G894 Chronic pain syndrome: Secondary | ICD-10-CM | POA: Diagnosis not present

## 2018-07-15 ENCOUNTER — Ambulatory Visit: Payer: BLUE CROSS/BLUE SHIELD | Admitting: Neurology

## 2018-07-19 ENCOUNTER — Ambulatory Visit: Payer: BLUE CROSS/BLUE SHIELD | Admitting: Physical Medicine & Rehabilitation

## 2018-07-24 ENCOUNTER — Ambulatory Visit
Admission: RE | Admit: 2018-07-24 | Discharge: 2018-07-24 | Disposition: A | Discharge status: Home / Self Care | Payer: BLUE CROSS/BLUE SHIELD | Source: Ambulatory Visit | Attending: Family Medicine | Admitting: Family Medicine

## 2018-07-24 DIAGNOSIS — R1011 Right upper quadrant pain: Secondary | ICD-10-CM

## 2018-07-26 DIAGNOSIS — B078 Other viral warts: Secondary | ICD-10-CM | POA: Diagnosis not present

## 2018-08-01 DIAGNOSIS — Q8502 Neurofibromatosis, type 2: Secondary | ICD-10-CM | POA: Diagnosis not present

## 2018-08-01 DIAGNOSIS — G40219 Localization-related (focal) (partial) symptomatic epilepsy and epileptic syndromes with complex partial seizures, intractable, without status epilepticus: Secondary | ICD-10-CM | POA: Diagnosis not present

## 2018-08-01 DIAGNOSIS — Z9689 Presence of other specified functional implants: Secondary | ICD-10-CM | POA: Diagnosis not present

## 2018-08-12 DIAGNOSIS — E039 Hypothyroidism, unspecified: Secondary | ICD-10-CM | POA: Diagnosis not present

## 2018-08-12 DIAGNOSIS — G8928 Other chronic postprocedural pain: Secondary | ICD-10-CM | POA: Diagnosis not present

## 2018-08-12 DIAGNOSIS — E559 Vitamin D deficiency, unspecified: Secondary | ICD-10-CM | POA: Diagnosis not present

## 2018-08-12 DIAGNOSIS — Z23 Encounter for immunization: Secondary | ICD-10-CM | POA: Diagnosis not present

## 2018-08-12 DIAGNOSIS — R7301 Impaired fasting glucose: Secondary | ICD-10-CM | POA: Diagnosis not present

## 2018-08-16 DIAGNOSIS — G527 Disorders of multiple cranial nerves: Secondary | ICD-10-CM | POA: Diagnosis not present

## 2018-08-16 DIAGNOSIS — H532 Diplopia: Secondary | ICD-10-CM | POA: Diagnosis not present

## 2018-08-16 DIAGNOSIS — D329 Benign neoplasm of meninges, unspecified: Secondary | ICD-10-CM | POA: Diagnosis not present

## 2018-08-16 DIAGNOSIS — Z981 Arthrodesis status: Secondary | ICD-10-CM | POA: Diagnosis not present

## 2018-08-21 DIAGNOSIS — D320 Benign neoplasm of cerebral meninges: Secondary | ICD-10-CM | POA: Insufficient documentation

## 2018-08-21 DIAGNOSIS — H4901 Third [oculomotor] nerve palsy, right eye: Secondary | ICD-10-CM | POA: Insufficient documentation

## 2018-08-21 DIAGNOSIS — M5481 Occipital neuralgia: Secondary | ICD-10-CM | POA: Insufficient documentation

## 2018-08-21 DIAGNOSIS — H4911 Fourth [trochlear] nerve palsy, right eye: Secondary | ICD-10-CM | POA: Insufficient documentation

## 2018-08-23 ENCOUNTER — Telehealth: Payer: Self-pay | Admitting: Neurology

## 2018-08-23 ENCOUNTER — Ambulatory Visit (INDEPENDENT_AMBULATORY_CARE_PROVIDER_SITE_OTHER): Payer: BC Managed Care – PPO | Admitting: Neurology

## 2018-08-23 ENCOUNTER — Other Ambulatory Visit: Payer: Self-pay

## 2018-08-23 DIAGNOSIS — G43709 Chronic migraine without aura, not intractable, without status migrainosus: Secondary | ICD-10-CM | POA: Diagnosis not present

## 2018-08-23 MED ORDER — ONABOTULINUMTOXINA 100 UNITS IJ SOLR
177.5000 [IU] | Freq: Once | INTRAMUSCULAR | Status: AC
  Administered 2018-08-23: 177.5 [IU] via INTRAMUSCULAR

## 2018-08-23 NOTE — Progress Notes (Signed)
Botulinum Clinic   Procedure Note Botox  Attending: Dr. Tomi Likens  Preoperative Diagnosis(es): Chronic migraine  Consent obtained from: The patient Benefits discussed included, but were not limited to decreased muscle tightness, increased joint range of motion, and decreased pain.  Risk discussed included, but were not limited pain and discomfort, bleeding, bruising, excessive weakness, venous thrombosis, muscle atrophy and dysphagia.  Anticipated outcomes of the procedure as well as he risks and benefits of the alternatives to the procedure, and the roles and tasks of the personnel to be involved, were discussed with the patient, and the patient consents to the procedure and agrees to proceed. A copy of the patient medication guide was given to the patient which explains the blackbox warning.  Patients identity and treatment sites confirmed Yes.  .  Details of Procedure: Skin was cleaned with alcohol. Prior to injection, the needle plunger was aspirated to make sure the needle was not within a blood vessel.  There was no blood retrieved on aspiration.    Following is a summary of the muscles injected  And the amount of Botulinum toxin used:  Dilution 200 units of Botox was reconstituted with 4 ml of preservative free normal saline. Time of reconstitution: At the time of the office visit (<30 minutes prior to injection)   Injections  155 total units of Botox was injected with a 30 gauge needle.  Injection Sites: L occipitalis: 15 units- 3 sites  R occiptalis: 15 units- 3 sites  L upper trapezius: 15 units- 3 sites R upper trapezius: 15 units- 3 sits          L paraspinal: 10 units- 2 sites R paraspinal: 10 units- 2 sites  Face L frontalis(2 injection sites):10 units   R frontalis(2 injection sites):10 units         L corrugator: 5 units   R corrugator: 5 units           Procerus: 7.5 units   L temporalis: 30 units R temporalis: 30 units   Agent:  200 units of botulinum Type A  (Onobotulinum Toxin type A) was reconstituted with 4 ml of preservative free normal saline.  Time of reconstitution: At the time of the office visit (<30 minutes prior to injection)     Total injected (Units): 177.5  Total wasted (Units): 14  Patient tolerated procedure well without complications.   Reinjection is anticipated in 3 months.

## 2018-08-23 NOTE — Telephone Encounter (Signed)
Patient is having a MRI done at [facility] on 11-22-18 and that is the next botox day her MRI is at 11:00 and it will be all day so she wants to come in to have Botox done after that sometime soon  Please advise she also wants to have [place] mail her the Botox chart that she sent to our office about 3 months ago I did not see it in media as [place] asked me to look there please advise and mail her the chart

## 2018-08-27 ENCOUNTER — Ambulatory Visit: Payer: Self-pay | Admitting: Surgery

## 2018-08-27 DIAGNOSIS — K8050 Calculus of bile duct without cholangitis or cholecystitis without obstruction: Secondary | ICD-10-CM | POA: Diagnosis not present

## 2018-08-27 NOTE — Telephone Encounter (Signed)
When should she have her botox injection?

## 2018-08-27 NOTE — H&P (View-Only) (Signed)
Surgical H&P  CC: abdominal pain  HPI: This is a very nice 58 year old lady with a history of neurofibromatosis 2 and seizure disorder who began having right upper quadrant pain about 2 months ago.  The first attack was a very severe and hard pain under the right subcostal margin, she said it was comparable to labor pains.  Since then she has had postprandial right upper quadrant pain nearly daily.  She does also endorse indigestion and a lot of gas.  Not nausea per se.  She has had no prior abdominal surgeries. Recent right upper quadrant ultrasound confirms gallstones. She has a history of neurovascular status is 2 and has had had several spine surgeries.  She has a known brain tumor which is being observed. She has a history of seizures and has a vagal nerve stimulator in her left upper chest. She states that she had a colonoscopy about 18 months ago and awoke from that having seizures. She also reports a family history of malignant hyperthermia.  Allergies  Allergen Reactions  . Cinnamon Anaphylaxis  . Clonazepam Rash    [name]  . Hydroxychloroquine     Other reaction(s): Other (See Comments) [name]  . Penicillins Anaphylaxis and Hives    Other reaction(s): Other, Other (See Comments)  . Sulfa Antibiotics Anaphylaxis, Hives, Other (See Comments) and Rash    Other reaction(s): Other Other reaction(s): Other Other reaction(s): Other   . Latex Rash    Latex questionnaire sent to or 12/26/12   . Tape Rash    With blisters With blisters     Past Medical History:  Diagnosis Date  . Fibromyalgia   . Generalized arthritis   . Hashimoto's disease   . Kidney stones   . Meningioma, recurrent of brain ([place])   . Migraines   . Neurofibromatosis 2 [facility])     Past Surgical History:  Procedure Laterality Date  . BACK SURGERY    . BRAIN SURGERY    . SHOULDER SURGERY Left   . WRIST ARTHROPLASTY Left     Family History  Problem Relation Age of Onset  .  Hypertension Mother   . Hypertension Father   . Thyroid disease Sister   . Hypertension Sister     Social History   Socioeconomic History  . Marital status: Married    Spouse name: Clair Gulling  . Number of children: 2  . Years of education: Not on file  . Highest education level: Associate degree: occupational, Hotel manager, or vocational program  Occupational History  . Occupation: disabled  Social Needs  . Financial resource strain: Not on file  . Food insecurity    Worry: Not on file    Inability: Not on file  . Transportation needs    Medical: Not on file    Non-medical: Not on file  Tobacco Use  . Smoking status: Never Smoker  . Smokeless tobacco: Never Used  Substance and Sexual Activity  . Alcohol use: Yes    Comment: socially, cocktails  . Drug use: Not on file  . Sexual activity: Not on file  Lifestyle  . Physical activity    Days per week: Not on file    Minutes per session: Not on file  . Stress: Not on file  Relationships  . Social Herbalist on phone: Not on file    Gets together: Not on file    Attends religious service: Not on file    Active member of club or organization: Not  on file    Attends meetings of clubs or organizations: Not on file    Relationship status: Not on file  Other Topics Concern  . Not on file  Social History Narrative   Patient is righ-handed. She lives with her husband in a 2 level home. She drinks 4 cups of coffee a day, and an occasional cup of tea.    Current Outpatient Medications on File Prior to Visit  Medication Sig Dispense Refill  . baclofen (LIORESAL) 10 MG tablet Take 1 tablet (10 mg total) by mouth 3 (three) times daily. 90 each 1  . Cholecalciferol (VITAMIN D3) 50 MCG (2000 UT) capsule Take 5,000 Units by mouth daily.     . cloNIDine (CATAPRES) 0.2 MG tablet Take by mouth.    . diclofenac sodium (VOLTAREN) 1 % GEL Apply 2 g topically 4 (four) times daily. 1 Tube 1  . DULoxetine (CYMBALTA) 30 MG capsule Take 1  capsule (30 mg total) by mouth daily. 30 capsule 1  . gabapentin (NEURONTIN) 100 MG capsule Take by mouth.    . levETIRAcetam (KEPPRA) 500 MG tablet     . levothyroxine (SYNTHROID) 112 MCG tablet Take by mouth.    . lidocaine (LIDODERM) 5 % Place 1 patch onto the skin daily. Remove & Discard patch within 12 hours or as directed by MD 30 patch 1  . LORazepam (ATIVAN) 1 MG tablet Take by mouth.    Marland Kitchen omeprazole (PRILOSEC) 20 MG capsule     . oxyCODONE-acetaminophen (PERCOCET) 7.5-325 MG tablet     . rosuvastatin (CRESTOR) 20 MG tablet     . triamterene-hydrochlorothiazide (DYAZIDE) 37.5-25 MG capsule Take by mouth.    . valACYclovir (VALTREX) 1000 MG tablet      No current facility-administered medications on file prior to visit.     Review of Systems: a complete, 10pt review of systems was completed with pertinent positives and negatives as documented in the HPI  Physical Exam:  Weight: 157.13 lb Height: 64in Body Surface Area: 1.77 m Body Mass Index: 26.97 kg/m  Temp.: 98.43F  Pulse: 78 (Regular)  P.OX: 98% (Room air) BP: 118/74 (Sitting, Left Arm, Standard)  Gen: alert and well appearing Eye: Right eye covered. Extraocular motion intact, no scleral icterus ENT: moist mucus membranes, dentition intact Neck: no mass or thyromegaly Chest: unlabored respirations, symmetrical air entry, clear bilaterally CV: regular rate and rhythm, no pedal edema Abdomen: soft, tender in epigastrium and right subcostal margin, nondistended. No mass or organomegaly MSK: strength symmetrical throughout, no deformity Neuro: grossly intact, normal gait Psych: normal mood and affect, appropriate insight Skin: warm and dry, no rash or lesion on limited exam   No flowsheet data found.  No flowsheet data found.  No results found for: INR, PROTIME  Imaging: No results found.    A/P: BILIARY COLIC (V76.16) Story: I recommend proceeding with laparoscopic cholecystectomy with possible  cholangiogram. Discussed risks of surgery including bleeding, pain, scarring, intraabdominal injury specifically to the common bile duct and sequelae, bile leak, conversion to open surgery, failure to resolve symptoms, blood clots/ pulmonary embolus, heart attack, pneumonia, stroke, death. Questions welcomed and answered to patient's satisfaction.   Romana Juniper, MD [facility] Surgery, [place] Pager [phone]

## 2018-08-27 NOTE — Telephone Encounter (Signed)
I had sent it in to be scanned.  Not sure if it was lost in the shuffle.  I don't have a diagram on my procedure note, but I have the amount listed for each muscle.

## 2018-08-27 NOTE — Telephone Encounter (Signed)
It can be any day that following week.

## 2018-08-27 NOTE — H&P (Signed)
Surgical H&P  CC: abdominal pain  HPI: This is a very nice 58 year old lady with a history of neurofibromatosis 2 and seizure disorder who began having right upper quadrant pain about 2 months ago.  The first attack was a very severe and hard pain under the right subcostal margin, she said it was comparable to labor pains.  Since then she has had postprandial right upper quadrant pain nearly daily.  She does also endorse indigestion and a lot of gas.  Not nausea per se.  She has had no prior abdominal surgeries. Recent right upper quadrant ultrasound confirms gallstones. She has a history of neurovascular status is 2 and has had had several spine surgeries.  She has a known brain tumor which is being observed. She has a history of seizures and has a vagal nerve stimulator in her left upper chest. She states that she had a colonoscopy about 18 months ago and awoke from that having seizures. She also reports a family history of malignant hyperthermia.  Allergies  Allergen Reactions  . Cinnamon Anaphylaxis  . Clonazepam Rash    [name]  . Hydroxychloroquine     Other reaction(s): Other (See Comments) [name]  . Penicillins Anaphylaxis and Hives    Other reaction(s): Other, Other (See Comments)  . Sulfa Antibiotics Anaphylaxis, Hives, Other (See Comments) and Rash    Other reaction(s): Other Other reaction(s): Other Other reaction(s): Other   . Latex Rash    Latex questionnaire sent to or 12/26/12   . Tape Rash    With blisters With blisters     Past Medical History:  Diagnosis Date  . Fibromyalgia   . Generalized arthritis   . Hashimoto's disease   . Kidney stones   . Meningioma, recurrent of brain ([place])   . Migraines   . Neurofibromatosis 2 [facility])     Past Surgical History:  Procedure Laterality Date  . BACK SURGERY    . BRAIN SURGERY    . SHOULDER SURGERY Left   . WRIST ARTHROPLASTY Left     Family History  Problem Relation Age of Onset  .  Hypertension Mother   . Hypertension Father   . Thyroid disease Sister   . Hypertension Sister     Social History   Socioeconomic History  . Marital status: Married    Spouse name: Clair Gulling  . Number of children: 2  . Years of education: Not on file  . Highest education level: Associate degree: occupational, Hotel manager, or vocational program  Occupational History  . Occupation: disabled  Social Needs  . Financial resource strain: Not on file  . Food insecurity    Worry: Not on file    Inability: Not on file  . Transportation needs    Medical: Not on file    Non-medical: Not on file  Tobacco Use  . Smoking status: Never Smoker  . Smokeless tobacco: Never Used  Substance and Sexual Activity  . Alcohol use: Yes    Comment: socially, cocktails  . Drug use: Not on file  . Sexual activity: Not on file  Lifestyle  . Physical activity    Days per week: Not on file    Minutes per session: Not on file  . Stress: Not on file  Relationships  . Social Herbalist on phone: Not on file    Gets together: Not on file    Attends religious service: Not on file    Active member of club or organization: Not  on file    Attends meetings of clubs or organizations: Not on file    Relationship status: Not on file  Other Topics Concern  . Not on file  Social History Narrative   Patient is righ-handed. She lives with her husband in a 2 level home. She drinks 4 cups of coffee a day, and an occasional cup of tea.    Current Outpatient Medications on File Prior to Visit  Medication Sig Dispense Refill  . baclofen (LIORESAL) 10 MG tablet Take 1 tablet (10 mg total) by mouth 3 (three) times daily. 90 each 1  . Cholecalciferol (VITAMIN D3) 50 MCG (2000 UT) capsule Take 5,000 Units by mouth daily.     . cloNIDine (CATAPRES) 0.2 MG tablet Take by mouth.    . diclofenac sodium (VOLTAREN) 1 % GEL Apply 2 g topically 4 (four) times daily. 1 Tube 1  . DULoxetine (CYMBALTA) 30 MG capsule Take 1  capsule (30 mg total) by mouth daily. 30 capsule 1  . gabapentin (NEURONTIN) 100 MG capsule Take by mouth.    . levETIRAcetam (KEPPRA) 500 MG tablet     . levothyroxine (SYNTHROID) 112 MCG tablet Take by mouth.    . lidocaine (LIDODERM) 5 % Place 1 patch onto the skin daily. Remove & Discard patch within 12 hours or as directed by MD 30 patch 1  . LORazepam (ATIVAN) 1 MG tablet Take by mouth.    Marland Kitchen omeprazole (PRILOSEC) 20 MG capsule     . oxyCODONE-acetaminophen (PERCOCET) 7.5-325 MG tablet     . rosuvastatin (CRESTOR) 20 MG tablet     . triamterene-hydrochlorothiazide (DYAZIDE) 37.5-25 MG capsule Take by mouth.    . valACYclovir (VALTREX) 1000 MG tablet      No current facility-administered medications on file prior to visit.     Review of Systems: a complete, 10pt review of systems was completed with pertinent positives and negatives as documented in the HPI  Physical Exam:  Weight: 157.13 lb Height: 64in Body Surface Area: 1.77 m Body Mass Index: 26.97 kg/m  Temp.: 98.50F  Pulse: 78 (Regular)  P.OX: 98% (Room air) BP: 118/74 (Sitting, Left Arm, Standard)  Gen: alert and well appearing Eye: Right eye covered. Extraocular motion intact, no scleral icterus ENT: moist mucus membranes, dentition intact Neck: no mass or thyromegaly Chest: unlabored respirations, symmetrical air entry, clear bilaterally CV: regular rate and rhythm, no pedal edema Abdomen: soft, tender in epigastrium and right subcostal margin, nondistended. No mass or organomegaly MSK: strength symmetrical throughout, no deformity Neuro: grossly intact, normal gait Psych: normal mood and affect, appropriate insight Skin: warm and dry, no rash or lesion on limited exam   No flowsheet data found.  No flowsheet data found.  No results found for: INR, PROTIME  Imaging: No results found.    A/P: BILIARY COLIC (J17.91) Story: I recommend proceeding with laparoscopic cholecystectomy with possible  cholangiogram. Discussed risks of surgery including bleeding, pain, scarring, intraabdominal injury specifically to the common bile duct and sequelae, bile leak, conversion to open surgery, failure to resolve symptoms, blood clots/ pulmonary embolus, heart attack, pneumonia, stroke, death. Questions welcomed and answered to patient's satisfaction.   Romana Juniper, MD [facility] Surgery, [place] Pager [phone]

## 2018-08-27 NOTE — Telephone Encounter (Signed)
Unable to locate botox chart patient sent.  When can she have her next botox?

## 2018-08-28 NOTE — Telephone Encounter (Signed)
I got her sch for 11-25-18

## 2018-08-29 DIAGNOSIS — G8929 Other chronic pain: Secondary | ICD-10-CM | POA: Diagnosis not present

## 2018-08-29 DIAGNOSIS — M542 Cervicalgia: Secondary | ICD-10-CM | POA: Diagnosis not present

## 2018-08-29 DIAGNOSIS — M419 Scoliosis, unspecified: Secondary | ICD-10-CM | POA: Diagnosis not present

## 2018-08-29 DIAGNOSIS — M546 Pain in thoracic spine: Secondary | ICD-10-CM | POA: Diagnosis not present

## 2018-08-29 DIAGNOSIS — M545 Low back pain: Secondary | ICD-10-CM | POA: Diagnosis not present

## 2018-08-29 DIAGNOSIS — Z981 Arthrodesis status: Secondary | ICD-10-CM | POA: Diagnosis not present

## 2018-08-29 DIAGNOSIS — G5623 Lesion of ulnar nerve, bilateral upper limbs: Secondary | ICD-10-CM | POA: Diagnosis not present

## 2018-08-29 DIAGNOSIS — M5136 Other intervertebral disc degeneration, lumbar region: Secondary | ICD-10-CM | POA: Diagnosis not present

## 2018-08-29 DIAGNOSIS — M5134 Other intervertebral disc degeneration, thoracic region: Secondary | ICD-10-CM | POA: Diagnosis not present

## 2018-08-29 DIAGNOSIS — D321 Benign neoplasm of spinal meninges: Secondary | ICD-10-CM | POA: Diagnosis not present

## 2018-08-30 ENCOUNTER — Other Ambulatory Visit (HOSPITAL_COMMUNITY)
Admission: RE | Admit: 2018-08-30 | Discharge: 2018-08-30 | Disposition: A | Discharge status: Home / Self Care | Payer: BC Managed Care – PPO | Source: Ambulatory Visit | Attending: Surgery | Admitting: Surgery

## 2018-08-30 DIAGNOSIS — Z1159 Encounter for screening for other viral diseases: Secondary | ICD-10-CM | POA: Insufficient documentation

## 2018-08-30 LAB — SARS CORONAVIRUS 2 (TAT 6-24 HRS): SARS Coronavirus 2: NEGATIVE

## 2018-09-02 ENCOUNTER — Encounter (HOSPITAL_COMMUNITY): Payer: Self-pay | Admitting: *Deleted

## 2018-09-02 ENCOUNTER — Other Ambulatory Visit: Payer: Self-pay

## 2018-09-02 MED ORDER — BUPIVACAINE LIPOSOME 1.3 % IJ SUSP
20.0000 mL | Freq: Once | INTRAMUSCULAR | Status: DC
  Filled 2018-09-02: qty 20

## 2018-09-02 NOTE — Progress Notes (Signed)
Anesthesia Chart Review: Lisa Burnett    Case: [id number] Date/Time: 09/03/18 0715   Procedure: LAPAROSCOPIC CHOLECYSTECTOMY (N/A )   Anesthesia type: General   Pre-op diagnosis: BILIARY COLIC   Location: [facility] OR ROOM 05 / [place] [facility]   Surgeon: Clovis Riley, MD      DISCUSSION: Patient is a 58 year old female scheduled for the above procedure.  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   She has a family history of MALIGNANT HYPERTHEMIA (two first cousins tested positive--mother's brother's son's). Anesthesia pre-evaluation note from 12/26/12 ([place]) indicate "planned non-triggering anesthetic" given family history of Malignant Hyperthermia. She had a > 3 second pause during right craniotomy in 2014, and has history of HLD and exertional dyspnea, so I will order a baseline preoperative EKG.    08/30/18 presurgical COVID test was negative. I will communicate to anesthesia team regarding FMHx of MH. Further evaluation on the day of surgery.   VS: Ht 5\' 4"  (1.626 m)   Wt 71.2 kg   LMP  (LMP Unknown)   BMI 26.95 kg/m   PROVIDERS: Caren Macadam, MD is PCP [facility]) - Metta Clines, DO is neurologist. - Argie Ramming, MD is Neuro-Ophthalmologist ([place]) - Also seen by Kandis Nab, MD at [facility]. She was felt clinically stable at 06/13/18 visit. She was referred to the [facility].     LABS: Day of surgery.  IMAGES: Abdominal US 07/24/18: IMPRESSION: Cholelithiasis without sonographic evidence of acute cholecystitis.   EKG: Day of surgery.    CV: N/A  Past Medical History:  Diagnosis Date  . Dyspnea    occasional with exertion  . Family history of adverse reaction to anesthesia    Mal Hyperthermia - Mother's brother 2 sons tested positive  . Fibromyalgia   . Generalized arthritis   . Hashimoto's disease   . History of kidney stones    passed stone - no surgery required  . Hyperlipidemia   . Hypothyroidism   . Malignant hyperthermia    No personal history, but family history MH (cousins: maternal uncle's sons); history trigger free anesthesia 2014  . Meningioma, recurrent of brain ([place])   . Migraines   . Neurofibromatosis 2 ([place])   . PONV (postoperative nausea and vomiting)   . Seizures ([place])    last seizure April 2020 due to heat - takes keppra  . SVD (spontaneous vaginal delivery)    x 2    Past Surgical History:  Procedure Laterality Date  . BACK SURGERY    . BRAIN SURGERY     neurofibromas type 2  . COLONOSCOPY    . EYE SURGERY     lasik -bilateral, wears patch over right eye  . nerve stimulation Left    below left shoulder blade goes up tp brain -side of left neck  . SHOULDER SURGERY Left   . UPPER GI ENDOSCOPY    . WRIST ARTHROPLASTY Left     MEDICATIONS: . [  START ON 09/03/2018] bupivacaine liposome (EXPAREL) 1.3 % injection 266 mg   . ARTIFICIAL TEAR SOLUTION OP  . Ascorbic Acid (VITAMIN C) 1000 MG tablet  . Cholecalciferol (VITAMIN D3) 50 MCG (2000 UT) capsule  . cloNIDine (CATAPRES) 0.2 MG tablet  . DULoxetine (CYMBALTA) 60 MG capsule  . gabapentin (NEURONTIN) 100 MG capsule  . HYDROcodone-acetaminophen (NORCO/VICODIN) 5-325 MG tablet  . levETIRAcetam (KEPPRA) 500 MG tablet  . levothyroxine (SYNTHROID)  88 MCG tablet  . LORazepam (ATIVAN) 1 MG tablet  . omeprazole (PRILOSEC) 20 MG capsule  . rosuvastatin (CRESTOR) 20 MG tablet  . triamterene-hydrochlorothiazide (DYAZIDE) 37.5-25 MG capsule  . valACYclovir (VALTREX) 1000 MG tablet  . vitamin B-12 (CYANOCOBALAMIN) 1000 MCG tablet  . Zinc 50 MG TABS  . baclofen (LIORESAL) 10 MG tablet  . diclofenac sodium (VOLTAREN) 1 % GEL  . DULoxetine (CYMBALTA) 30 MG capsule  . lidocaine (LIDODERM) 5 %    Myra Gianotti, PA-C Surgical Short Stay/Anesthesiology [facility] Phone [phone] [facility] Phone [phone] 09/02/2018 3:35 PM

## 2018-09-02 NOTE — Progress Notes (Signed)
Patient informed of the [place] that is currently in effect.  Patient verbalized understanding.  Patient denies shortness of breath, fever, cough and chest pain.  PCP - Dr Mannie Stabile @ [facility] - Denies Neurology:Dr Metta Clines  Chest x-ray - Denies EKG - Denies Stress Test - Denies ECHO - Denies Cardiac Cath - Denies  Anesthesia review: yes -consult ordered   STOP now taking any Aspirin (unless otherwise instructed by your surgeon), Aleve, Naproxen, Ibuprofen, Motrin, Advil, Goody's, BC's, all herbal medications, fish oil, and all vitamins.  Coronavirus Screening Have you or your husband experienced the following symptoms:  Cough yes/no: No Fever (>100.60F)  yes/no: No Runny nose yes/no: No Sore throat yes/no: No Difficulty breathing/shortness of breath  yes/no: No  Have you or your husband traveled in the last 14 days and where? yes/no: No

## 2018-09-02 NOTE — Anesthesia Preprocedure Evaluation (Addendum)
Anesthesia Evaluation  Patient identified by MRN, date of birth, ID band Patient awake    Reviewed: Allergy & Precautions, H&P , NPO status , Patient's Chart, lab work & pertinent test results  History of Anesthesia Complications (+) PONV, MALIGNANT HYPERTHERMIA and Family history of anesthesia reaction  Airway Mallampati: III  TM Distance: >3 FB Neck ROM: Full    Dental no notable dental hx. (+) Teeth Intact, Dental Advisory Given   Pulmonary neg pulmonary ROS,    Pulmonary exam normal breath sounds clear to auscultation       Cardiovascular Exercise Tolerance: Good negative cardio ROS   Rhythm:Regular Rate:Normal     Neuro/Psych  Headaches, Seizures -,  negative psych ROS   GI/Hepatic negative GI ROS, Neg liver ROS,   Endo/Other  Hypothyroidism   Renal/GU negative Renal ROS  negative genitourinary   Musculoskeletal  (+) Arthritis , Osteoarthritis,  Fibromyalgia -  Abdominal   Peds  Hematology negative hematology ROS (+)   Anesthesia Other Findings   Reproductive/Obstetrics negative OB ROS                            Anesthesia Physical Anesthesia Plan  ASA: II  Anesthesia Plan: General   Post-op Pain Management:    Induction: Intravenous  PONV Risk Score and Plan: 4 or greater and Ondansetron, Dexamethasone, Propofol infusion, TIVA, Midazolam and Scopolamine patch - Pre-op  Airway Management Planned: Oral ETT  Additional Equipment:   Intra-op Plan:   Post-operative Plan: Extubation in OR  Informed Consent: I have reviewed the patients History and Physical, chart, labs and discussed the procedure including the risks, benefits and alternatives for the proposed anesthesia with the patient or authorized representative who has indicated his/her understanding and acceptance.     Dental advisory given  Plan Discussed with: CRNA  Anesthesia Plan Comments: (PAT note written  09/02/2018 by Myra Gianotti, PA-C. Patient is a same day work-up. She has a history of trigger free anesthesia in 2014 due to family history of Malignant Hyperthermia. )       Anesthesia Quick Evaluation

## 2018-09-03 ENCOUNTER — Ambulatory Visit (HOSPITAL_COMMUNITY)
Admission: RE | Admit: 2018-09-03 | Discharge: 2018-09-03 | Disposition: A | Discharge status: Home / Self Care | Payer: BC Managed Care – PPO | Attending: Surgery | Admitting: Surgery

## 2018-09-03 ENCOUNTER — Ambulatory Visit (HOSPITAL_COMMUNITY): Payer: BC Managed Care – PPO | Admitting: Vascular Surgery

## 2018-09-03 ENCOUNTER — Encounter (HOSPITAL_COMMUNITY)
Admission: RE | Disposition: A | Discharge status: Home / Self Care | Payer: Self-pay | Source: Home / Self Care | Attending: Surgery

## 2018-09-03 DIAGNOSIS — Z91040 Latex allergy status: Secondary | ICD-10-CM | POA: Insufficient documentation

## 2018-09-03 DIAGNOSIS — Z880 Allergy status to penicillin: Secondary | ICD-10-CM | POA: Insufficient documentation

## 2018-09-03 DIAGNOSIS — Z791 Long term (current) use of non-steroidal anti-inflammatories (NSAID): Secondary | ICD-10-CM | POA: Diagnosis not present

## 2018-09-03 DIAGNOSIS — E785 Hyperlipidemia, unspecified: Secondary | ICD-10-CM | POA: Diagnosis not present

## 2018-09-03 DIAGNOSIS — Z91048 Other nonmedicinal substance allergy status: Secondary | ICD-10-CM | POA: Diagnosis not present

## 2018-09-03 DIAGNOSIS — K8010 Calculus of gallbladder with chronic cholecystitis without obstruction: Secondary | ICD-10-CM | POA: Diagnosis not present

## 2018-09-03 DIAGNOSIS — G43909 Migraine, unspecified, not intractable, without status migrainosus: Secondary | ICD-10-CM | POA: Diagnosis not present

## 2018-09-03 DIAGNOSIS — Z882 Allergy status to sulfonamides status: Secondary | ICD-10-CM | POA: Insufficient documentation

## 2018-09-03 DIAGNOSIS — G43709 Chronic migraine without aura, not intractable, without status migrainosus: Secondary | ICD-10-CM | POA: Diagnosis not present

## 2018-09-03 DIAGNOSIS — E063 Autoimmune thyroiditis: Secondary | ICD-10-CM | POA: Insufficient documentation

## 2018-09-03 DIAGNOSIS — Q8502 Neurofibromatosis, type 2: Secondary | ICD-10-CM | POA: Insufficient documentation

## 2018-09-03 DIAGNOSIS — Z8349 Family history of other endocrine, nutritional and metabolic diseases: Secondary | ICD-10-CM | POA: Insufficient documentation

## 2018-09-03 DIAGNOSIS — Z887 Allergy status to serum and vaccine status: Secondary | ICD-10-CM | POA: Diagnosis not present

## 2018-09-03 DIAGNOSIS — M797 Fibromyalgia: Secondary | ICD-10-CM | POA: Insufficient documentation

## 2018-09-03 DIAGNOSIS — K828 Other specified diseases of gallbladder: Secondary | ICD-10-CM | POA: Diagnosis not present

## 2018-09-03 DIAGNOSIS — Z8249 Family history of ischemic heart disease and other diseases of the circulatory system: Secondary | ICD-10-CM | POA: Insufficient documentation

## 2018-09-03 DIAGNOSIS — M1990 Unspecified osteoarthritis, unspecified site: Secondary | ICD-10-CM | POA: Diagnosis not present

## 2018-09-03 DIAGNOSIS — Z87442 Personal history of urinary calculi: Secondary | ICD-10-CM | POA: Insufficient documentation

## 2018-09-03 DIAGNOSIS — N3000 Acute cystitis without hematuria: Secondary | ICD-10-CM | POA: Diagnosis not present

## 2018-09-03 DIAGNOSIS — Z79899 Other long term (current) drug therapy: Secondary | ICD-10-CM | POA: Diagnosis not present

## 2018-09-03 DIAGNOSIS — G40909 Epilepsy, unspecified, not intractable, without status epilepticus: Secondary | ICD-10-CM | POA: Diagnosis not present

## 2018-09-03 DIAGNOSIS — M5481 Occipital neuralgia: Secondary | ICD-10-CM | POA: Diagnosis not present

## 2018-09-03 DIAGNOSIS — K8044 Calculus of bile duct with chronic cholecystitis without obstruction: Secondary | ICD-10-CM | POA: Diagnosis not present

## 2018-09-03 HISTORY — DX: Other specified postprocedural states: R11.2

## 2018-09-03 HISTORY — DX: Family history of other specified conditions: Z84.89

## 2018-09-03 HISTORY — DX: Hypothyroidism, unspecified: E03.9

## 2018-09-03 HISTORY — PX: CHOLECYSTECTOMY: SHX55

## 2018-09-03 HISTORY — DX: Personal history of urinary calculi: Z87.442

## 2018-09-03 HISTORY — DX: Other specified postprocedural states: Z98.890

## 2018-09-03 HISTORY — DX: Hyperlipidemia, unspecified: E78.5

## 2018-09-03 HISTORY — DX: Malignant hyperthermia due to anesthesia, initial encounter: T88.3XXA

## 2018-09-03 HISTORY — DX: Unspecified convulsions: R56.9

## 2018-09-03 HISTORY — DX: Dyspnea, unspecified: R06.00

## 2018-09-03 LAB — CBC
HCT: 42.3 % (ref 36.0–46.0)
Hemoglobin: 13.5 g/dL (ref 12.0–15.0)
MCH: 27.3 pg (ref 26.0–34.0)
MCHC: 31.9 g/dL (ref 30.0–36.0)
MCV: 85.6 fL (ref 80.0–100.0)
Platelets: 192 10*3/uL (ref 150–400)
RBC: 4.94 MIL/uL (ref 3.87–5.11)
RDW: 12.8 % (ref 11.5–15.5)
WBC: 6.1 10*3/uL (ref 4.0–10.5)
nRBC: 0 % (ref 0.0–0.2)

## 2018-09-03 SURGERY — LAPAROSCOPIC CHOLECYSTECTOMY
Anesthesia: General | Site: Abdomen

## 2018-09-03 MED ORDER — PROPOFOL 500 MG/50ML IV EMUL
INTRAVENOUS | Status: DC | PRN
  Administered 2018-09-03: 100 ug/kg/min via INTRAVENOUS

## 2018-09-03 MED ORDER — SODIUM CHLORIDE 0.9 % IV SOLN: 250.0000 mL | INTRAVENOUS | Status: DC | PRN

## 2018-09-03 MED ORDER — SODIUM CHLORIDE 0.9% FLUSH: 3.0000 mL | INTRAVENOUS | Status: DC | PRN

## 2018-09-03 MED ORDER — SODIUM CHLORIDE 0.9% FLUSH: 3.0000 mL | Freq: Two times a day (BID) | INTRAVENOUS | Status: DC

## 2018-09-03 MED ORDER — ACETAMINOPHEN 325 MG PO TABS: 650.0000 mg | ORAL_TABLET | ORAL | Status: DC | PRN

## 2018-09-03 MED ORDER — FENTANYL CITRATE (PF) 100 MCG/2ML IJ SOLN: 25.0000 ug | INTRAMUSCULAR | Status: DC | PRN

## 2018-09-03 MED ORDER — PROMETHAZINE HCL 25 MG/ML IJ SOLN
6.2500 mg | Freq: Once | INTRAMUSCULAR | Status: AC
  Administered 2018-09-03: 6.25 mg via INTRAVENOUS

## 2018-09-03 MED ORDER — SUGAMMADEX SODIUM 200 MG/2ML IV SOLN
INTRAVENOUS | Status: DC | PRN
  Administered 2018-09-03: 142.4 mg via INTRAVENOUS

## 2018-09-03 MED ORDER — ROCURONIUM BROMIDE 50 MG/5ML IV SOSY
PREFILLED_SYRINGE | INTRAVENOUS | Status: DC | PRN
  Administered 2018-09-03: 40 mg via INTRAVENOUS

## 2018-09-03 MED ORDER — DEXAMETHASONE SODIUM PHOSPHATE 10 MG/ML IJ SOLN
INTRAMUSCULAR | Status: DC | PRN
  Administered 2018-09-03: 5 mg via INTRAVENOUS

## 2018-09-03 MED ORDER — HYDROCODONE-ACETAMINOPHEN 5-325 MG PO TABS
1.0000 | ORAL_TABLET | ORAL | Status: DC | PRN
  Administered 2018-09-03: 1 via ORAL

## 2018-09-03 MED ORDER — MIDAZOLAM HCL 2 MG/2ML IJ SOLN
INTRAMUSCULAR | Status: DC | PRN
  Administered 2018-09-03: 2 mg via INTRAVENOUS

## 2018-09-03 MED ORDER — CHLORHEXIDINE GLUCONATE 4 % EX LIQD: 60.0000 mL | Freq: Once | CUTANEOUS | Status: DC

## 2018-09-03 MED ORDER — ONDANSETRON HCL 4 MG/2ML IJ SOLN
INTRAMUSCULAR | Status: DC | PRN
  Administered 2018-09-03: 4 mg via INTRAVENOUS

## 2018-09-03 MED ORDER — HYDROMORPHONE HCL 1 MG/ML IJ SOLN
INTRAMUSCULAR | Status: AC
  Filled 2018-09-03: qty 1

## 2018-09-03 MED ORDER — LACTATED RINGERS IV SOLN
INTRAVENOUS | Status: DC | PRN
  Administered 2018-09-03 (×2): via INTRAVENOUS

## 2018-09-03 MED ORDER — ACETAMINOPHEN 650 MG RE SUPP: 650.0000 mg | RECTAL | Status: DC | PRN

## 2018-09-03 MED ORDER — SCOPOLAMINE 1 MG/3DAYS TD PT72
MEDICATED_PATCH | TRANSDERMAL | Status: AC
  Filled 2018-09-03: qty 1

## 2018-09-03 MED ORDER — ROCURONIUM BROMIDE 10 MG/ML (PF) SYRINGE
PREFILLED_SYRINGE | INTRAVENOUS | Status: AC
  Filled 2018-09-03: qty 10

## 2018-09-03 MED ORDER — SODIUM CHLORIDE 0.9 % IR SOLN
Status: DC | PRN
  Administered 2018-09-03: 1000 mL

## 2018-09-03 MED ORDER — PROPOFOL 10 MG/ML IV BOLUS
INTRAVENOUS | Status: DC | PRN
  Administered 2018-09-03: 120 mg via INTRAVENOUS

## 2018-09-03 MED ORDER — DEXAMETHASONE SODIUM PHOSPHATE 10 MG/ML IJ SOLN
INTRAMUSCULAR | Status: AC
  Filled 2018-09-03: qty 1

## 2018-09-03 MED ORDER — ONDANSETRON HCL 4 MG/2ML IJ SOLN
INTRAMUSCULAR | Status: AC
  Filled 2018-09-03: qty 2

## 2018-09-03 MED ORDER — 0.9 % SODIUM CHLORIDE (POUR BTL) OPTIME
TOPICAL | Status: DC | PRN
  Administered 2018-09-03: 1000 mL

## 2018-09-03 MED ORDER — FENTANYL CITRATE (PF) 250 MCG/5ML IJ SOLN
INTRAMUSCULAR | Status: AC
  Filled 2018-09-03: qty 5

## 2018-09-03 MED ORDER — PROPOFOL 10 MG/ML IV BOLUS
INTRAVENOUS | Status: AC
  Filled 2018-09-03: qty 20

## 2018-09-03 MED ORDER — DIPHENHYDRAMINE HCL 50 MG/ML IJ SOLN
INTRAMUSCULAR | Status: DC | PRN
  Administered 2018-09-03: 12.5 mg via INTRAVENOUS

## 2018-09-03 MED ORDER — LIDOCAINE 2% (20 MG/ML) 5 ML SYRINGE
INTRAMUSCULAR | Status: AC
  Filled 2018-09-03: qty 5

## 2018-09-03 MED ORDER — LIDOCAINE 2% (20 MG/ML) 5 ML SYRINGE
INTRAMUSCULAR | Status: DC | PRN
  Administered 2018-09-03: 60 mg via INTRAVENOUS

## 2018-09-03 MED ORDER — DOCUSATE SODIUM 100 MG PO CAPS: 100.0000 mg | ORAL_CAPSULE | Freq: Two times a day (BID) | ORAL | 0 refills | Status: AC

## 2018-09-03 MED ORDER — MIDAZOLAM HCL 2 MG/2ML IJ SOLN
INTRAMUSCULAR | Status: AC
  Filled 2018-09-03: qty 2

## 2018-09-03 MED ORDER — HYDROCODONE-ACETAMINOPHEN 5-325 MG PO TABS: 1.0000 | ORAL_TABLET | Freq: Four times a day (QID) | ORAL | 0 refills | Status: AC | PRN

## 2018-09-03 MED ORDER — BUPIVACAINE-EPINEPHRINE 0.25% -1:200000 IJ SOLN
INTRAMUSCULAR | Status: DC | PRN
  Administered 2018-09-03: 20 mL

## 2018-09-03 MED ORDER — FENTANYL CITRATE (PF) 250 MCG/5ML IJ SOLN
INTRAMUSCULAR | Status: DC | PRN
  Administered 2018-09-03: 50 ug via INTRAVENOUS
  Administered 2018-09-03: 100 ug via INTRAVENOUS
  Administered 2018-09-03: 50 ug via INTRAVENOUS

## 2018-09-03 MED ORDER — HYDROMORPHONE HCL 1 MG/ML IJ SOLN: 0.2500 mg | INTRAMUSCULAR | Status: DC | PRN

## 2018-09-03 MED ORDER — SCOPOLAMINE 1 MG/3DAYS TD PT72
MEDICATED_PATCH | TRANSDERMAL | Status: DC | PRN
  Administered 2018-09-03: 1 via TRANSDERMAL

## 2018-09-03 MED ORDER — PROMETHAZINE HCL 25 MG/ML IJ SOLN
INTRAMUSCULAR | Status: AC
  Filled 2018-09-03: qty 1

## 2018-09-03 MED ORDER — ACETAMINOPHEN 500 MG PO TABS
1000.0000 mg | ORAL_TABLET | Freq: Once | ORAL | Status: AC
  Administered 2018-09-03: 06:00:00 1000 mg via ORAL
  Filled 2018-09-03: qty 2

## 2018-09-03 MED ORDER — INDOCYANINE GREEN 25 MG IV SOLR
2.5000 mg | INTRAVENOUS | Status: DC
  Filled 2018-09-03 (×2): qty 25

## 2018-09-03 MED ORDER — VANCOMYCIN HCL IN DEXTROSE 1-5 GM/200ML-% IV SOLN
1000.0000 mg | INTRAVENOUS | Status: AC
  Administered 2018-09-03: 1000 mg via INTRAVENOUS
  Filled 2018-09-03: qty 200

## 2018-09-03 MED ORDER — HYDROCODONE-ACETAMINOPHEN 5-325 MG PO TABS
ORAL_TABLET | ORAL | Status: AC
  Filled 2018-09-03: qty 1

## 2018-09-03 MED ORDER — DIPHENHYDRAMINE HCL 50 MG/ML IJ SOLN
INTRAMUSCULAR | Status: AC
  Filled 2018-09-03: qty 1

## 2018-09-03 MED ORDER — BUPIVACAINE-EPINEPHRINE (PF) 0.25% -1:200000 IJ SOLN
INTRAMUSCULAR | Status: AC
  Filled 2018-09-03: qty 30

## 2018-09-03 SURGICAL SUPPLY — 35 items
APPLIER CLIP 5 13 M/L LIGAMAX5 (MISCELLANEOUS) ×2
BLADE CLIPPER SURG (BLADE) IMPLANT
CANISTER SUCT 3000ML PPV (MISCELLANEOUS) ×2 IMPLANT
CHLORAPREP W/TINT 26 (MISCELLANEOUS) ×2 IMPLANT
CLIP APPLIE 5 13 M/L LIGAMAX5 (MISCELLANEOUS) ×1 IMPLANT
COVER SURGICAL LIGHT HANDLE (MISCELLANEOUS) ×2 IMPLANT
COVER WAND RF STERILE (DRAPES) ×1 IMPLANT
DERMABOND ADVANCED (GAUZE/BANDAGES/DRESSINGS) ×1
DERMABOND ADVANCED .7 DNX12 (GAUZE/BANDAGES/DRESSINGS) ×1 IMPLANT
ELECT REM PT RETURN 9FT ADLT (ELECTROSURGICAL) ×2
ELECTRODE REM PT RTRN 9FT ADLT (ELECTROSURGICAL) ×1 IMPLANT
GLOVE BIO SURGEON STRL SZ 6 (GLOVE) ×2 IMPLANT
GLOVE INDICATOR 6.5 STRL GRN (GLOVE) ×2 IMPLANT
GOWN STRL REUS W/ TWL LRG LVL3 (GOWN DISPOSABLE) ×3 IMPLANT
GOWN STRL REUS W/TWL LRG LVL3 (GOWN DISPOSABLE) ×3
GRASPER SUT TROCAR 14GX15 (MISCELLANEOUS) ×2 IMPLANT
KIT BASIN OR (CUSTOM PROCEDURE TRAY) ×2 IMPLANT
KIT TURNOVER KIT B (KITS) ×2 IMPLANT
NDL INSUFFLATION 14GA 120MM (NEEDLE) ×1 IMPLANT
NEEDLE INSUFFLATION 14GA 120MM (NEEDLE) ×2 IMPLANT
NS IRRIG 1000ML POUR BTL (IV SOLUTION) ×2 IMPLANT
PAD ARMBOARD 7.5X6 YLW CONV (MISCELLANEOUS) ×2 IMPLANT
POUCH SPECIMEN RETRIEVAL 10MM (ENDOMECHANICALS) ×2 IMPLANT
SCISSORS LAP 5X35 DISP (ENDOMECHANICALS) ×2 IMPLANT
SET IRRIG TUBING LAPAROSCOPIC (IRRIGATION / IRRIGATOR) ×2 IMPLANT
SET TUBE SMOKE EVAC HIGH FLOW (TUBING) ×2 IMPLANT
SLEEVE ENDOPATH XCEL 5M (ENDOMECHANICALS) ×4 IMPLANT
SPECIMEN JAR SMALL (MISCELLANEOUS) ×2 IMPLANT
SUT MNCRL AB 4-0 PS2 18 (SUTURE) ×2 IMPLANT
TOWEL GREEN STERILE FF (TOWEL DISPOSABLE) ×2 IMPLANT
TOWEL OR 17X26 10 PK STRL BLUE (TOWEL DISPOSABLE) IMPLANT
TRAY LAPAROSCOPIC MC (CUSTOM PROCEDURE TRAY) ×2 IMPLANT
TROCAR XCEL NON-BLD 11X100MML (ENDOMECHANICALS) ×2 IMPLANT
TROCAR XCEL NON-BLD 5MMX100MML (ENDOMECHANICALS) ×2 IMPLANT
WATER STERILE IRR 1000ML POUR (IV SOLUTION) ×1 IMPLANT

## 2018-09-03 NOTE — Discharge Instructions (Signed)
[facility] ______[facility] LAPAROSCOPIC SURGERY: POST OP INSTRUCTIONS Always review your discharge instruction sheet given to you by the facility where your surgery was performed. IF YOU HAVE DISABILITY OR FAMILY LEAVE FORMS, YOU MUST BRING THEM TO THE OFFICE FOR PROCESSING.   DO NOT GIVE THEM TO YOUR DOCTOR.  1. A prescription for pain medication may be given to you upon discharge.  Take your pain medication as prescribed, if needed.  If narcotic pain medicine is not needed, then you may take acetaminophen (Tylenol) or ibuprofen (Advil) as needed. 2. Take your usually prescribed medications unless otherwise directed. 3. If you need a refill on your pain medication, please contact your pharmacy.  They will contact our office to request authorization. Prescriptions will not be filled after 5pm or on week-ends. 4. You should follow a light diet the first few days after arrival home, such as soup and crackers, etc.  Be sure to include lots of fluids daily. 5. Most patients will experience some swelling and bruising in the area of the incisions.  Ice packs will help.  Swelling and bruising can take several days to resolve.  6. It is common to experience some constipation if taking pain medication after surgery.  Increasing fluid intake and taking a stool softener (such as Colace) will usually help or prevent this problem from occurring.  A mild laxative (Milk of Magnesia or Miralax) should be taken according to package instructions if there are no bowel movements after 48 hours. 7. Unless discharge instructions indicate otherwise, you may remove your bandages 24-48 hours after surgery, and you may shower at that time.  You may have steri-strips (small skin tapes) in place directly over the incision.  These strips should be left on the skin for 7-10 days.  If your surgeon used skin glue on the incision, you may shower in 24 hours.  The glue will flake off over the next 2-3 weeks.  Any sutures or  staples will be removed at the office during your follow-up visit. 8. ACTIVITIES:  You may resume regular (light) daily activities beginning the next day--such as daily self-care, walking, climbing stairs--gradually increasing activities as tolerated.  You may have sexual intercourse when it is comfortable.  Refrain from any heavy lifting or straining until approved by your doctor. a. You may drive when you are no longer taking prescription pain medication, you can comfortably wear a seatbelt, and you can safely maneuver your car and apply brakes. b. Return to work:  1-2 weeks 9. You should see your doctor in the office for a follow-up appointment approximately 2-3 weeks after your surgery.  Make sure that you call for this appointment within a day or two after you arrive home to insure a convenient appointment time.  WHEN TO CALL YOUR DOCTOR: 1. Fever over 101.0 2. Inability to urinate 3. Continued bleeding from incision. 4. Increased pain, redness, or drainage from the incision. 5. Increasing abdominal pain  The clinic staff is available to answer your questions during regular business hours.  Please dont hesitate to call and ask to speak to one of the nurses for clinical concerns.  If you have a medical emergency, go to the nearest emergency room or call 911.  A surgeon from [facility] is always on call at the hospital. [address], [place], [place], [place]  [address] ? [address] [place], [place],    [address] [phone] ? [phone] ? FAX [phone] Web site: [email or web address]

## 2018-09-03 NOTE — Anesthesia Procedure Notes (Signed)
Procedure Name: Intubation Date/Time: 09/03/2018 7:41 AM Performed by: Kathryne Hitch, CRNA Pre-anesthesia Checklist: Patient identified, Emergency Drugs available, Suction available and Patient being monitored Patient Re-evaluated:Patient Re-evaluated prior to induction Oxygen Delivery Method: Circle system utilized Preoxygenation: Pre-oxygenation with 100% oxygen Induction Type: IV induction Ventilation: Mask ventilation without difficulty Laryngoscope Size: Miller and 2 Grade View: Grade I Tube type: Oral Tube size: 7.0 mm Number of attempts: 1 Airway Equipment and Method: Stylet and Oral airway Placement Confirmation: ETT inserted through vocal cords under direct vision,  positive ETCO2 and breath sounds checked- equal and bilateral Secured at: 22 cm Tube secured with: Tape Dental Injury: Teeth and Oropharynx as per pre-operative assessment

## 2018-09-03 NOTE — Interval H&P Note (Signed)
History and Physical Interval Note:  09/03/2018 7:06 AM  Debbra Riding  has presented today for surgery, with the diagnosis of BILIARY COLIC.  The various methods of treatment have been discussed with the patient and family. After consideration of risks, benefits and other options for treatment, the patient has consented to  Procedure(s): LAPAROSCOPIC CHOLECYSTECTOMY (N/A) as a surgical intervention.  The patient's history has been reviewed, patient examined, no change in status, stable for surgery.  I have reviewed the patient's chart and labs.  Questions were answered to the patient's satisfaction.     March Steyer Rich Brave

## 2018-09-03 NOTE — Op Note (Signed)
Operative Note  Lisa Burnett 58 y.o. female [id number]  09/03/2018  Surgeon: Clovis Riley MD FACS  Assistant: OR staff  Procedure performed: Laparoscopic Cholecystectomy  Preop diagnosis: biliary colic/ chronic cholecystitis Post-op diagnosis/intraop findings: same  Specimens: gallbladder  Retained items: none  EBL: minimal  Complications: none  Description of procedure: After obtaining informed consent the patient was brought to the operating room. Antibiotics were administered. SCD's were applied. General endotracheal anesthesia was initiated and a formal time-out was performed. The abdomen was prepped and draped in the usual sterile fashion and the abdomen was entered using an infraumbilical veress needle after instilling the site with local. Insufflation to 80mmHg was obtained, 106mm trocar and camera inserted, and gross inspection revealed no evidence of injury from our entry or other intraabdominal abnormalities. Two 55mm trocars were introduced in the right midclavicular and right anterior axillary lines under direct visualization and following infiltration with local. An 66mm trocar was placed in the epigastrium. Adhesions of omentum and duodenum to the gallbladder were carefully taken down bluntly and with cautery where indicated ensuring no injury to the duodenum. The gallbladder was retracted cephalad and the infundibulum was retracted laterally. A combination of hook electrocautery and blunt dissection was utilized to clear the peritoneum from the neck and cystic duct, circumferentially isolating the cystic artery and cystic duct and lifting the gallbladder from the cystic plate. The critical view of safety was achieved with the cystic artery, cystic duct, and liver bed visualized between them with no other structures. The artery was clipped with a single clip proximally and distally and divided as was the cystic duct with three clips on the proximal end. The gallbladder was  dissected from the liver plate using electrocautery. Once freed the gallbladder was placed in an endocatch bag and removed intact through the epigastric trocar site. A small amount of bleeding on the liver bed was controlled with cautery. The right upper quadrant was irrigated and aspirated; the effluent was clear. Hemostasis was once again confirmed, and reinspection of the abdomen revealed no injuries. The clips were well opposed without any bile leak from the duct or the liver bed. The 50mm trocar site in the epigastrium was closed with a 0 vicryl in the fascia under direct visualization using a PMI device. The abdomen was desufflated and all trocars removed. The skin incisions were closed with running subcuticular monocryl and Dermabond. The patient was awakened, extubated and transported to the recovery room in stable condition.    All counts were correct at the completion of the case.

## 2018-09-03 NOTE — Anesthesia Postprocedure Evaluation (Signed)
Anesthesia Post Note  Patient: Lisa Burnett  Procedure(s) Performed: LAPAROSCOPIC CHOLECYSTECTOMY (N/A Abdomen)     Patient location during evaluation: PACU Anesthesia Type: General Level of consciousness: awake and alert Pain management: pain level controlled Vital Signs Assessment: post-procedure vital signs reviewed and stable Respiratory status: spontaneous breathing, nonlabored ventilation and respiratory function stable Cardiovascular status: blood pressure returned to baseline and stable Postop Assessment: no apparent nausea or vomiting Anesthetic complications: no    Last Vitals:  Vitals:   09/03/18 0915 09/03/18 0930  BP: 134/69 138/75  Pulse: 71 64  Resp: 14 13  Temp:  (!) 36.2 C  SpO2: 97% 97%    Last Pain:  Vitals:   09/03/18 0930  TempSrc:   PainSc: Asleep                 Manuel Dall,W. EDMOND

## 2018-09-03 NOTE — Transfer of Care (Signed)
Immediate Anesthesia Transfer of Care Note  Patient: Lisa Burnett  Procedure(s) Performed: LAPAROSCOPIC CHOLECYSTECTOMY (N/A Abdomen)  Patient Location: PACU  Anesthesia Type:General  Level of Consciousness: awake, alert  and oriented  Airway & Oxygen Therapy: Patient Spontanous Breathing and Patient connected to face mask oxygen  Post-op Assessment: Report given to RN and Post -op Vital signs reviewed and stable  Post vital signs: Reviewed and stable  Last Vitals:  Vitals Value Taken Time  BP 134/123 09/03/18 0848  Temp    Pulse 78 09/03/18 0849  Resp 14 09/03/18 0849  SpO2 93 % 09/03/18 0849  Vitals shown include unvalidated device data.  Last Pain:  Vitals:   09/03/18 0620  TempSrc:   PainSc: 5       Patients Stated Pain Goal: 3 (05/39/76 7341)  Complications: No apparent anesthesia complications

## 2018-09-04 ENCOUNTER — Encounter (HOSPITAL_COMMUNITY): Payer: Self-pay | Admitting: Surgery

## 2018-09-19 DIAGNOSIS — G5622 Lesion of ulnar nerve, left upper limb: Secondary | ICD-10-CM | POA: Diagnosis not present

## 2018-09-19 DIAGNOSIS — G5621 Lesion of ulnar nerve, right upper limb: Secondary | ICD-10-CM | POA: Diagnosis not present

## 2018-09-19 DIAGNOSIS — G5602 Carpal tunnel syndrome, left upper limb: Secondary | ICD-10-CM | POA: Diagnosis not present

## 2018-09-19 DIAGNOSIS — G5601 Carpal tunnel syndrome, right upper limb: Secondary | ICD-10-CM | POA: Diagnosis not present

## 2018-09-24 DIAGNOSIS — M546 Pain in thoracic spine: Secondary | ICD-10-CM | POA: Diagnosis not present

## 2018-09-24 DIAGNOSIS — M4324 Fusion of spine, thoracic region: Secondary | ICD-10-CM | POA: Diagnosis not present

## 2018-09-27 ENCOUNTER — Other Ambulatory Visit: Payer: Self-pay | Admitting: Physician Assistant

## 2018-09-27 DIAGNOSIS — M546 Pain in thoracic spine: Secondary | ICD-10-CM

## 2018-09-27 DIAGNOSIS — M4324 Fusion of spine, thoracic region: Secondary | ICD-10-CM

## 2018-10-01 ENCOUNTER — Ambulatory Visit
Admission: RE | Admit: 2018-10-01 | Discharge: 2018-10-01 | Disposition: A | Discharge status: Home / Self Care | Payer: BC Managed Care – PPO | Source: Ambulatory Visit | Attending: Physician Assistant | Admitting: Physician Assistant

## 2018-10-01 DIAGNOSIS — M546 Pain in thoracic spine: Secondary | ICD-10-CM

## 2018-10-01 DIAGNOSIS — M4324 Fusion of spine, thoracic region: Secondary | ICD-10-CM

## 2018-10-07 DIAGNOSIS — M545 Low back pain: Secondary | ICD-10-CM | POA: Diagnosis not present

## 2018-10-07 DIAGNOSIS — M898X8 Other specified disorders of bone, other site: Secondary | ICD-10-CM | POA: Diagnosis not present

## 2018-10-07 DIAGNOSIS — G8929 Other chronic pain: Secondary | ICD-10-CM | POA: Diagnosis not present

## 2018-10-16 DIAGNOSIS — E039 Hypothyroidism, unspecified: Secondary | ICD-10-CM | POA: Diagnosis not present

## 2018-10-29 ENCOUNTER — Other Ambulatory Visit: Payer: Self-pay

## 2018-10-29 DIAGNOSIS — I83893 Varicose veins of bilateral lower extremities with other complications: Secondary | ICD-10-CM

## 2018-10-30 DIAGNOSIS — G5601 Carpal tunnel syndrome, right upper limb: Secondary | ICD-10-CM | POA: Diagnosis not present

## 2018-10-30 DIAGNOSIS — G5622 Lesion of ulnar nerve, left upper limb: Secondary | ICD-10-CM | POA: Diagnosis not present

## 2018-10-30 DIAGNOSIS — G5623 Lesion of ulnar nerve, bilateral upper limbs: Secondary | ICD-10-CM | POA: Diagnosis not present

## 2018-10-30 DIAGNOSIS — G5602 Carpal tunnel syndrome, left upper limb: Secondary | ICD-10-CM | POA: Diagnosis not present

## 2018-10-30 DIAGNOSIS — G5603 Carpal tunnel syndrome, bilateral upper limbs: Secondary | ICD-10-CM | POA: Diagnosis not present

## 2018-10-30 DIAGNOSIS — G5621 Lesion of ulnar nerve, right upper limb: Secondary | ICD-10-CM | POA: Diagnosis not present

## 2018-10-31 ENCOUNTER — Encounter: Payer: Self-pay | Admitting: Vascular Surgery

## 2018-10-31 ENCOUNTER — Other Ambulatory Visit: Payer: Self-pay

## 2018-10-31 ENCOUNTER — Ambulatory Visit (INDEPENDENT_AMBULATORY_CARE_PROVIDER_SITE_OTHER): Payer: BC Managed Care – PPO | Admitting: Vascular Surgery

## 2018-10-31 ENCOUNTER — Ambulatory Visit (HOSPITAL_COMMUNITY)
Admission: RE | Admit: 2018-10-31 | Discharge: 2018-10-31 | Disposition: A | Discharge status: Home / Self Care | Payer: BC Managed Care – PPO | Source: Ambulatory Visit | Attending: Vascular Surgery | Admitting: Vascular Surgery

## 2018-10-31 VITALS — BP 110/74 | HR 87 | Temp 97.9°F | Resp 14 | Ht 64.0 in | Wt 160.0 lb

## 2018-10-31 DIAGNOSIS — I83893 Varicose veins of bilateral lower extremities with other complications: Secondary | ICD-10-CM | POA: Diagnosis not present

## 2018-10-31 DIAGNOSIS — G5603 Carpal tunnel syndrome, bilateral upper limbs: Secondary | ICD-10-CM | POA: Diagnosis not present

## 2018-10-31 DIAGNOSIS — I83813 Varicose veins of bilateral lower extremities with pain: Secondary | ICD-10-CM | POA: Diagnosis not present

## 2018-10-31 DIAGNOSIS — G5623 Lesion of ulnar nerve, bilateral upper limbs: Secondary | ICD-10-CM | POA: Diagnosis not present

## 2018-10-31 NOTE — Progress Notes (Signed)
REASON FOR CONSULT:    Painful varicose veins.  The consult is requested by Dr. Caren Macadam.  ASSESSMENT & PLAN:   CHRONIC VENOUS INSUFFICIENCY: This patient does have a deep venous reflux on the right and some superficial venous reflux on the left.  However she does not have evidence of DVT or significant symptoms.  We have discussed the importance of intermittent leg elevation and the proper positioning for this.  I have given her a prescription for knee-high compression stockings with a gradient of 15 to 20 mmHg.  I have encouraged her to avoid prolonged sitting and standing.  We discussed the importance of exercise specifically walking and water aerobics.  We also discussed nutrition.  Certainly I be happy to see her back if her symptoms progress.  The varicose vein over her right knee is not especially tender and there is no evidence of phlebitis.  I think this is too large for sclerotherapy.  I reassured her that she has no evidence of arterial insufficiency.   Deitra Mayo, MD, FACS Beeper [phone] Office: [phone]   HPI:   Lisa Burnett is a pleasant 58 y.o. female, who presents for evaluation of a varicose vein over her right knee which is sometimes tender.  She tells me that she is previously had some sclerotherapy for varicose veins.  She does describe some aching pain and heaviness in her legs associated with prolonged sitting and standing and relieved with elevation.  She has no previous history of DVT.  Today she does complain of some left hip pain which is new and began about 3 days ago.  She also has a history of low back pain and has had back surgery in 2012.  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   I have reviewed her records from the referring office.  The patient was seen on 07/12/2018.  She does have chronic pain syndrome, hypothyroidism, hyperlipidemia, and the known history of neurofibromatosis.  She was noted to have a  varicose vein over her anterior right knee and is referred for vascular evaluation.  Past Medical History:  Diagnosis Date  . Dyspnea    occasional with exertion  . Family history of adverse reaction to anesthesia    Mal Hyperthermia - Mother's brother 2 sons tested positive  . Fibromyalgia   . Generalized arthritis   . Hashimoto's disease   . History of kidney stones    passed stone - no surgery required  . Hyperlipidemia   . Hypothyroidism   . Malignant hyperthermia    No personal history, but family history MH (cousins: maternal uncle's sons); history trigger free anesthesia 2014  . 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 Migraines   . Neurofibromatosis 2 ([place])   . PONV (postoperative nausea and vomiting)   . Seizures ([place])    last seizure April 2020 due to heat - takes keppra  . SVD (spontaneous vaginal delivery)    x 2    Family History  Problem Relation Age of Onset  . Hypertension Mother   . Hypertension Father   . Thyroid disease Sister   . Hypertension Sister     SOCIAL HISTORY: Social History   Socioeconomic History  . Marital status: Married    Spouse name: Clair Gulling  . Number of children: 2  . Years of education: Not on file  . Highest education level: Associate degree: occupational, Hotel manager, or vocational program  Occupational History  . Occupation: disabled  Social Needs  . Financial resource strain: Not on file  . Food insecurity    Worry: Not on file    Inability: Not on file  . Transportation needs    Medical: Not on file    Non-medical: Not on file  Tobacco Use  . Smoking status: Never Smoker  . Smokeless tobacco: Never Used  Substance and Sexual Activity  . Alcohol use: Yes    Comment: socially, cocktails  . Drug use: Never  . Sexual activity: Not on file  Lifestyle  . Physical activity    Days per week: Not on file    Minutes per session: Not on file  . Stress: Not on file  Relationships  . Social Herbalist on phone: Not  on file    Gets together: Not on file    Attends religious service: Not on file    Active member of club or organization: Not on file    Attends meetings of clubs or organizations: Not on file    Relationship status: Not on file  . Intimate partner violence    Fear of current or ex partner: Not on file    Emotionally abused: Not on file    Physically abused: Not on file    Forced sexual activity: Not on file  Other Topics Concern  . Not on file  Social History Narrative   Patient is righ-handed. She lives with her husband in a 2 level home. She drinks 4 cups of coffee a day, and an occasional cup of tea.    Allergies  Allergen Reactions  . Cinnamon Anaphylaxis  . Clonazepam Rash    [name]  . Hydroxychloroquine     [name]  . Penicillins Anaphylaxis and Hives    Did it involve swelling of the face/tongue/throat, SOB, or low BP? Yes Did it involve sudden or severe rash/hives, skin peeling, or any reaction on the inside of your mouth or nose? Yes Did you need to seek medical attention at a hospital or doctor's office? Yes When did it last happen?10 + years If all above answers are "NO", may proceed with cephalosporin use.   . Sulfa Antibiotics Anaphylaxis, Hives, Rash and Other (See Comments)       . Latex Rash  . Baclofen     Caused seizures   . Oxycodone     Felt weird   . Tape Rash    With blisters     Current Outpatient Medications  Medication Sig Dispense Refill  . ARTIFICIAL TEAR SOLUTION OP Place 1 drop into both eyes 2 (two) times a day.    . Ascorbic Acid (VITAMIN C) 1000 MG tablet Take 1,000 mg by mouth daily.    . Cholecalciferol (VITAMIN D3) 50 MCG (2000 UT) capsule Take 2,000 Units by mouth daily.     . cloNIDine (CATAPRES) 0.2 MG tablet Take 0.2 mg by mouth 2 (two) times daily.     . DULoxetine (CYMBALTA) 60 MG capsule Take 60 mg by mouth daily.    Marland Kitchen gabapentin (NEURONTIN) 100 MG capsule Take 100-200 mg by mouth See admin  instructions. Take 100 mg in the morning and 200 mg at night    . HYDROcodone-acetaminophen (NORCO/VICODIN) 5-325 MG tablet Take 1 tablet by mouth every 6 (six) hours as needed for moderate pain or severe pain. 15 tablet 0  . levETIRAcetam (KEPPRA) 500 MG tablet Take 500 mg by mouth 2 (two) times daily.     Marland Kitchen levothyroxine (  SYNTHROID) 88 MCG tablet Take 88 mcg by mouth daily before breakfast.    . LORazepam (ATIVAN) 1 MG tablet Take 1 mg by mouth daily as needed for seizure or sleep.     Marland Kitchen omeprazole (PRILOSEC) 20 MG capsule Take 20 mg by mouth at bedtime as needed (acid reflux).     . rosuvastatin (CRESTOR) 20 MG tablet Take 20 mg by mouth at bedtime.     . triamterene-hydrochlorothiazide (DYAZIDE) 37.5-25 MG capsule Take 1 capsule by mouth daily.     Marland Kitchen triamterene-hydrochlorothiazide (MAXZIDE-25) 37.5-25 MG tablet     . valACYclovir (VALTREX) 1000 MG tablet Take 1,000 mg by mouth every 12 (twelve) hours as needed (fever blisters).     . vitamin B-12 (CYANOCOBALAMIN) 1000 MCG tablet Take 1,000 mcg by mouth daily.    . Zinc 50 MG TABS Take 50 mg by mouth daily.     No current facility-administered medications for this visit.     REVIEW OF SYSTEMS:  [X]  denotes positive finding, [ ]  denotes negative finding Cardiac  Comments:  Chest pain or chest pressure:    Shortness of breath upon exertion: x   Short of breath when lying flat:    Irregular heart rhythm:        Vascular    Pain in calf, thigh, or hip brought on by ambulation: x   Pain in feet at night that wakes you up from your sleep:  x   Blood clot in your veins:    Leg swelling:         Pulmonary    Oxygen at home:    Productive cough:     Wheezing:         Neurologic    Sudden weakness in arms or legs:     Sudden numbness in arms or legs:     Sudden onset of difficulty speaking or slurred speech:    Temporary loss of vision in one eye:     Problems with dizziness:  x       Gastrointestinal    Blood in stool:      Vomited blood:         Genitourinary    Burning when urinating:     Blood in urine:        Psychiatric    Major depression:         Hematologic    Bleeding problems:    Problems with blood clotting too easily:        Skin    Rashes or ulcers:        Constitutional    Fever or chills:     PHYSICAL EXAM:   Vitals:   10/31/18 1455  BP: 110/74  Pulse: 87  Resp: 14  Temp: 97.9 F (36.6 C)  TempSrc: Temporal  SpO2: 98%  Weight: 160 lb (72.6 kg)  Height: 5\' 4"  (1.626 m)    GENERAL: The patient is a well-nourished female, in no acute distress. The vital signs are documented above. CARDIAC: There is a regular rate and rhythm.  VASCULAR: I do not detect carotid bruits. She has palpable dorsalis pedis pulses bilaterally.      PULMONARY: There is good air exchange bilaterally without wheezing or rales. ABDOMEN: Soft and non-tender with normal pitched bowel sounds.  MUSCULOSKELETAL: There are no major deformities or cyanosis. NEUROLOGIC: No focal weakness or paresthesias are detected. SKIN: There are no ulcers or rashes noted. PSYCHIATRIC: The patient has a normal affect.  DATA:  VENOUS DUPLEX: I have independently interpreted her venous duplex scan today.  On the right side there is no evidence of DVT.  There is deep venous reflux involving the common femoral vein.  There is no significant superficial venous reflux.  On the left side there is no evidence of deep venous thrombosis.  There is no evidence of reflux in the deep venous system.  There is reflux in the superficial venous system involving the saphenofemoral junction and also the great saphenous vein in the proximal calf.  The vein is not especially dilated.

## 2018-11-12 DIAGNOSIS — Q8500 Neurofibromatosis, unspecified: Secondary | ICD-10-CM | POA: Diagnosis not present

## 2018-11-12 DIAGNOSIS — E785 Hyperlipidemia, unspecified: Secondary | ICD-10-CM | POA: Diagnosis not present

## 2018-11-12 DIAGNOSIS — M5432 Sciatica, left side: Secondary | ICD-10-CM | POA: Diagnosis not present

## 2018-11-12 DIAGNOSIS — E039 Hypothyroidism, unspecified: Secondary | ICD-10-CM | POA: Diagnosis not present

## 2018-11-13 DIAGNOSIS — G5622 Lesion of ulnar nerve, left upper limb: Secondary | ICD-10-CM | POA: Diagnosis not present

## 2018-11-13 DIAGNOSIS — G5601 Carpal tunnel syndrome, right upper limb: Secondary | ICD-10-CM | POA: Diagnosis not present

## 2018-11-13 DIAGNOSIS — G5602 Carpal tunnel syndrome, left upper limb: Secondary | ICD-10-CM | POA: Diagnosis not present

## 2018-11-13 DIAGNOSIS — G5621 Lesion of ulnar nerve, right upper limb: Secondary | ICD-10-CM | POA: Diagnosis not present

## 2018-11-19 DIAGNOSIS — Z20828 Contact with and (suspected) exposure to other viral communicable diseases: Secondary | ICD-10-CM | POA: Diagnosis not present

## 2018-11-19 DIAGNOSIS — Z1159 Encounter for screening for other viral diseases: Secondary | ICD-10-CM | POA: Diagnosis not present

## 2018-11-21 DIAGNOSIS — G5601 Carpal tunnel syndrome, right upper limb: Secondary | ICD-10-CM | POA: Diagnosis not present

## 2018-11-21 DIAGNOSIS — E039 Hypothyroidism, unspecified: Secondary | ICD-10-CM | POA: Diagnosis not present

## 2018-11-21 DIAGNOSIS — G629 Polyneuropathy, unspecified: Secondary | ICD-10-CM | POA: Diagnosis not present

## 2018-11-21 DIAGNOSIS — G8918 Other acute postprocedural pain: Secondary | ICD-10-CM | POA: Diagnosis not present

## 2018-11-21 DIAGNOSIS — M79641 Pain in right hand: Secondary | ICD-10-CM | POA: Diagnosis not present

## 2018-11-21 DIAGNOSIS — G5621 Lesion of ulnar nerve, right upper limb: Secondary | ICD-10-CM | POA: Diagnosis not present

## 2018-11-21 DIAGNOSIS — Z79899 Other long term (current) drug therapy: Secondary | ICD-10-CM | POA: Diagnosis not present

## 2018-11-21 DIAGNOSIS — M069 Rheumatoid arthritis, unspecified: Secondary | ICD-10-CM | POA: Diagnosis not present

## 2018-11-22 DIAGNOSIS — Q8502 Neurofibromatosis, type 2: Secondary | ICD-10-CM | POA: Diagnosis not present

## 2018-11-22 DIAGNOSIS — D320 Benign neoplasm of cerebral meninges: Secondary | ICD-10-CM | POA: Diagnosis not present

## 2018-11-22 DIAGNOSIS — Z981 Arthrodesis status: Secondary | ICD-10-CM | POA: Diagnosis not present

## 2018-11-22 DIAGNOSIS — Z87891 Personal history of nicotine dependence: Secondary | ICD-10-CM | POA: Diagnosis not present

## 2018-11-22 DIAGNOSIS — D321 Benign neoplasm of spinal meninges: Secondary | ICD-10-CM | POA: Diagnosis not present

## 2018-11-25 ENCOUNTER — Ambulatory Visit: Payer: BC Managed Care – PPO | Admitting: Neurology

## 2018-11-27 ENCOUNTER — Ambulatory Visit (INDEPENDENT_AMBULATORY_CARE_PROVIDER_SITE_OTHER): Payer: BC Managed Care – PPO | Admitting: Neurology

## 2018-11-27 ENCOUNTER — Other Ambulatory Visit: Payer: Self-pay

## 2018-11-27 DIAGNOSIS — G43709 Chronic migraine without aura, not intractable, without status migrainosus: Secondary | ICD-10-CM | POA: Diagnosis not present

## 2018-11-27 MED ORDER — ONABOTULINUMTOXINA 100 UNITS IJ SOLR
177.5000 [IU] | Freq: Once | INTRAMUSCULAR | Status: AC
  Administered 2018-11-27: 177.5 [IU] via INTRAMUSCULAR

## 2018-11-27 NOTE — Progress Notes (Signed)
22.5 units wasted

## 2018-11-27 NOTE — Progress Notes (Signed)
[facility]   Procedure Note Botox  Attending: Dr. Metta Clines  Preoperative Diagnosis(es): Chronic migraine  Consent obtained from: Yes Benefits discussed included, but were not limited to decreased muscle tightness, increased joint range of motion, and decreased pain.  Risk discussed included, but were not limited pain and discomfort, bleeding, bruising, excessive weakness, venous thrombosis, muscle atrophy and dysphagia.  Anticipated outcomes of the procedure as well as he risks and benefits of the alternatives to the procedure, and the roles and tasks of the personnel to be involved, were discussed with the patient, and the patient consents to the procedure and agrees to proceed. A copy of the patient medication guide was given to the patient which explains the blackbox warning.  Patients identity and treatment sites confirmed Yes  Details of Procedure: Skin was cleaned with alcohol. Prior to injection, the needle plunger was aspirated to make sure the needle was not within a blood vessel.  There was no blood retrieved on aspiration.    Following is a summary of the muscles injected  And the amount of Botulinum toxin used:  Dilution 200 units of Botox was reconstituted with 4 ml of preservative free normal saline. Time of reconstitution: At the time of the office visit (<30 minutes prior to injection)   Injections  155 total units of Botox was injected with a 30 gauge needle.  Injection Sites: L occipitalis: 15 units- 3 sites  R occiptalis: 15 units- 3 sites  L upper trapezius: 15 units- 3 sites R upper trapezius: 15 units- 3 sits          L paraspinal: 10 units- 2 sites R paraspinal: 10 units- 2 sites  Face L frontalis(2 injection sites):10 units   R frontalis(2 injection sites):10 units         L corrugator: 5 units   R corrugator: 5 units           Procerus: 7.5 units   L temporalis: 30 units R temporalis: 30 units   Agent:  200 units of botulinum Type A  (Onobotulinum Toxin type A) was reconstituted with 4 ml of preservative free normal saline.  Time of reconstitution: At the time of the office visit (<30 minutes prior to injection)     Total injected (Units): 177.5  Total wasted (Units): 22.5  Patient tolerated procedure well without complications.   Reinjection is anticipated in 3 months.

## 2018-12-02 DIAGNOSIS — G527 Disorders of multiple cranial nerves: Secondary | ICD-10-CM | POA: Diagnosis not present

## 2018-12-02 DIAGNOSIS — D320 Benign neoplasm of cerebral meninges: Secondary | ICD-10-CM | POA: Diagnosis not present

## 2018-12-02 DIAGNOSIS — H532 Diplopia: Secondary | ICD-10-CM | POA: Diagnosis not present

## 2018-12-04 DIAGNOSIS — G5622 Lesion of ulnar nerve, left upper limb: Secondary | ICD-10-CM | POA: Diagnosis not present

## 2018-12-04 DIAGNOSIS — Z9889 Other specified postprocedural states: Secondary | ICD-10-CM | POA: Diagnosis not present

## 2018-12-04 DIAGNOSIS — G5621 Lesion of ulnar nerve, right upper limb: Secondary | ICD-10-CM | POA: Diagnosis not present

## 2018-12-04 DIAGNOSIS — D321 Benign neoplasm of spinal meninges: Secondary | ICD-10-CM | POA: Diagnosis not present

## 2018-12-04 DIAGNOSIS — G5601 Carpal tunnel syndrome, right upper limb: Secondary | ICD-10-CM | POA: Diagnosis not present

## 2018-12-04 DIAGNOSIS — G5602 Carpal tunnel syndrome, left upper limb: Secondary | ICD-10-CM | POA: Diagnosis not present

## 2018-12-04 DIAGNOSIS — Z4889 Encounter for other specified surgical aftercare: Secondary | ICD-10-CM | POA: Diagnosis not present

## 2018-12-09 DIAGNOSIS — K115 Sialolithiasis: Secondary | ICD-10-CM | POA: Diagnosis not present

## 2018-12-10 DIAGNOSIS — K115 Sialolithiasis: Secondary | ICD-10-CM | POA: Diagnosis not present

## 2018-12-11 ENCOUNTER — Other Ambulatory Visit (HOSPITAL_COMMUNITY)
Admission: RE | Admit: 2018-12-11 | Discharge: 2018-12-11 | Disposition: A | Discharge status: Home / Self Care | Payer: BC Managed Care – PPO | Source: Ambulatory Visit | Attending: Otolaryngology | Admitting: Otolaryngology

## 2018-12-11 DIAGNOSIS — Z20828 Contact with and (suspected) exposure to other viral communicable diseases: Secondary | ICD-10-CM | POA: Diagnosis not present

## 2018-12-11 DIAGNOSIS — Z01812 Encounter for preprocedural laboratory examination: Secondary | ICD-10-CM | POA: Insufficient documentation

## 2018-12-11 LAB — SARS CORONAVIRUS 2 (TAT 6-24 HRS): SARS Coronavirus 2: NEGATIVE

## 2018-12-11 NOTE — H&P (Signed)
HPI:   Lisa [facility] is a 58 y.o. female who presents as a consult Patient.   Referring Provider: Cheree Ditto, MD  Chief complaint: Neck swelling.  HPI: Intermittent right submandibular swelling related to meals for about a year and a half. She moved here from [place] about 7 months ago. She suffers with neurofibromatosis type II. The last time she had swelling was about 2 weeks ago. She suffers with significant dry mouth.  PMH/Meds/All/SocHx/FamHx/ROS:   No past medical history on file.  No past surgical history on file.  No family history of bleeding disorders, wound healing problems or difficulty with anesthesia.   Social History   Socioeconomic History  . Marital status: Unknown  Spouse name: Not on file  . Number of children: Not on file  . Years of education: Not on file  . Highest education level: Not on file  Occupational History  . Not on file  Social Needs  . Financial resource strain: Not on file  . Food insecurity  Worry: Not on file  Inability: Not on file  . Transportation needs  Medical: Not on file  Non-medical: Not on file  Tobacco Use  . Smoking status: Not on file  Substance and Sexual Activity  . Alcohol use: Not on file  . Drug use: Not on file  . Sexual activity: Not on file  Lifestyle  . Physical activity  Days per week: Not on file  Minutes per session: Not on file  . Stress: Not on file  Relationships  . Social Medical illustrator on phone: Not on file  Gets together: Not on file  Attends religious service: Not on file  Active member of club or organization: Not on file  Attends meetings of clubs or organizations: Not on file  Relationship status: Not on file  Other Topics Concern  . Not on file  Social History Narrative  . Not on file   Current Outpatient Medications:  . cholecalciferol, vitamin D3, 50 mcg (2,000 unit) Cap, Take 1 capsule by mouth., Disp: , Rfl:  . cloNIDine HCL (CATAPRES) 0.2 MG tablet, Take by  mouth., Disp: , Rfl:  . gabapentin (NEURONTIN) 100 MG capsule, Take 100 mg by mouth., Disp: , Rfl:  . levETIRAcetam (KEPPRA) 500 MG tablet, Take by mouth., Disp: , Rfl:  . levothyroxine (SYNTHROID) 112 MCG tablet, Take 112 mcg by mouth., Disp: , Rfl:  . LORazepam (ATIVAN) 1 MG tablet, Take by mouth daily as needed., Disp: , Rfl:  . MAGNESIUM ORAL, Take by mouth., Disp: , Rfl:  . oxyCODONE-acetaminophen (PERCOCET) 7.5-325 mg per tablet, , Disp: , Rfl:  . rosuvastatin (CRESTOR) 20 MG tablet, Take 20 mg by mouth., Disp: , Rfl:  . triamterene-hydrochlorothiazide (DYAZIDE) 37.5-25 mg per capsule, Take 1 capsule by mouth., Disp: , Rfl:  . valACYclovir (VALTREX) 1000 MG tablet, , Disp: , Rfl:  . HYDROcodone-acetaminophen (NORCO 10-325) 10-325 mg per tablet, Take by mouth., Disp: , Rfl:   A complete ROS was performed with pertinent positives/negatives noted in the HPI. The remainder of the ROS are negative.   Physical Exam:   Ht 1.626 m (5\' 4" )  Wt 68 kg (150 lb)  BMI 25.75 kg/m   General: Healthy and alert, in no distress, breathing easily. Normal affect. In a pleasant mood. Head: Normocephalic, atraumatic. No masses, or scars. Eyes: Pupils are equal, and reactive to light. Vision is grossly intact. No spontaneous or gaze nystagmus. Ears: Ear canals are clear. Tympanic membranes are intact, with normal  landmarks and the middle ears are clear and healthy. Hearing: Grossly normal. Nose: Nasal cavities are clear with healthy mucosa, no polyps or exudate. Airways are patent. Face: No masses or scars, facial nerve function is symmetric. Oral Cavity: No mucosal abnormalities are noted. Tongue with normal mobility. Dentition appears healthy. Floor of mouth is soft to palpation with slight tenderness along the posterior duct. Oropharynx: Tonsils are symmetric. There are no mucosal masses identified. Tongue base appears normal and healthy. Larynx/Hypopharynx: deferred Chest: Deferred Neck: No  palpable masses, no cervical adenopathy, no thyroid nodules or enlargement. Neuro: Cranial nerves II-XII with normal function. Balance: Normal gate. Other findings: none.  Independent Review of Additional Tests or Records:  CT scan reveals a small calculus right at the hilum of the gland on the right side is seen in the below images.  Procedures:  none  Impression & Plans:  This is concerning for submandibular obstruction possibly by a calculus, the best way to discern this is with CT imaging. We will see what this shows.  Small submandibular calculus likely the cause of all of her problems. Given the location I do not think that intraoral removal is going to be feasible. I think removal of the gland is going to be her best bet. Given the current pandemic situation that we will have to wait. She is instructed to drink lots of fluids and stay well-hydrated, massage the gland couple of times daily. Call us if there is any swelling that does not resolve, sometimes antibiotics can be helpful. We will schedule surgery when we are able to.

## 2018-12-11 NOTE — Pre-Procedure Instructions (Signed)
Lisa Burnett  12/11/2018      [facility] [place], [place] [address] [place] [address] [place] [address] Phone: [phone] Fax: [phone]    Your procedure is scheduled on Oct. 9  Report to [facility] Entrance A at 7:30 A.M.  Call this number if you have problems the morning of surgery:  [phone]   Remember:  Do not eat or drink after midnight.      Take these medicines the morning of surgery with A SIP OF WATER :              Clonidine (catapres)             Gabapentin (neurontin)              Hydrocodone if needed             levetiracetam (keppra)             Levothyroxine (synthroid)             Lorazepam (ativan) if needed             Eye drops         7 days prior to surgery STOP taking any Aspirin (unless otherwise instructed by your surgeon), Aleve, Naproxen, Ibuprofen, Motrin, Advil, Goody's, BC's, all herbal medications, fish oil, and all vitamins.                 Do not wear jewelry, make-up or nail polish.  Do not wear lotions, powders, or perfumes, or deodorant.  Do not shave 48 hours prior to surgery.  Men may shave face and neck.  Do not bring valuables to the hospital.  [facility] is not responsible for any belongings or valuables.  Contacts, dentures or bridgework may not be worn into surgery.  Leave your suitcase in the car.  After surgery it may be brought to your room.  For patients admitted to the hospital, discharge time will be determined by your treatment team.  Patients discharged the day of surgery will not be allowed to drive home.    Special instructions:   [facility]- Preparing For Surgery  Before surgery, you can play an important role. Because skin is not sterile, your skin needs to be as free of germs as possible. You can reduce the number of germs on your skin by washing with CHG (chlorahexidine gluconate) Soap before surgery.  CHG is an antiseptic  cleaner which kills germs and bonds with the skin to continue killing germs even after washing.    Oral Hygiene is also important to reduce your risk of infection.  Remember - BRUSH YOUR TEETH THE MORNING OF SURGERY WITH YOUR REGULAR TOOTHPASTE  Please do not use if you have an allergy to CHG or antibacterial soaps. If your skin becomes reddened/irritated stop using the CHG.  Do not shave (including legs and underarms) for at least 48 hours prior to first CHG shower. It is OK to shave your face.  Please follow these instructions carefully.   1. Shower the NIGHT BEFORE SURGERY and the MORNING OF SURGERY with CHG.   2. If you chose to wash your hair, wash your hair first as usual with your normal shampoo.  3. After you shampoo, rinse your hair and body thoroughly to remove the shampoo.  4. Use CHG as you would any other liquid soap. You can apply CHG  directly to the skin and wash gently with a scrungie or a clean washcloth.   5. Apply the CHG Soap to your body ONLY FROM THE NECK DOWN.  Do not use on open wounds or open sores. Avoid contact with your eyes, ears, mouth and genitals (private parts). Wash Face and genitals (private parts)  with your normal soap.  6. Wash thoroughly, paying special attention to the area where your surgery will be performed.  7. Thoroughly rinse your body with warm water from the neck down.  8. DO NOT shower/wash with your normal soap after using and rinsing off the CHG Soap.  9. Pat yourself dry with a CLEAN TOWEL.  10. Wear CLEAN PAJAMAS to bed the night before surgery, wear comfortable clothes the morning of surgery  11. Place CLEAN SHEETS on your bed the night of your first shower and DO NOT SLEEP WITH PETS.    Day of Surgery:  Do not apply any deodorants/lotions.  Please wear clean clothes to the hospital/surgery center.   Remember to brush your teeth WITH YOUR REGULAR TOOTHPASTE.    Please read over the following fact sheets that you were  given. Coughing and Deep Breathing and Surgical Site Infection Prevention

## 2018-12-12 ENCOUNTER — Encounter (HOSPITAL_COMMUNITY): Payer: Self-pay

## 2018-12-12 ENCOUNTER — Encounter (HOSPITAL_COMMUNITY)
Admission: RE | Admit: 2018-12-12 | Discharge: 2018-12-12 | Disposition: A | Discharge status: Home / Self Care | Payer: BC Managed Care – PPO | Source: Ambulatory Visit | Attending: Otolaryngology | Admitting: Otolaryngology

## 2018-12-12 ENCOUNTER — Other Ambulatory Visit: Payer: Self-pay

## 2018-12-12 DIAGNOSIS — R0600 Dyspnea, unspecified: Secondary | ICD-10-CM | POA: Diagnosis not present

## 2018-12-12 DIAGNOSIS — E039 Hypothyroidism, unspecified: Secondary | ICD-10-CM | POA: Insufficient documentation

## 2018-12-12 DIAGNOSIS — E063 Autoimmune thyroiditis: Secondary | ICD-10-CM | POA: Insufficient documentation

## 2018-12-12 DIAGNOSIS — Z0000 Encounter for general adult medical examination without abnormal findings: Secondary | ICD-10-CM | POA: Diagnosis not present

## 2018-12-12 DIAGNOSIS — R569 Unspecified convulsions: Secondary | ICD-10-CM | POA: Insufficient documentation

## 2018-12-12 DIAGNOSIS — M797 Fibromyalgia: Secondary | ICD-10-CM | POA: Diagnosis not present

## 2018-12-12 DIAGNOSIS — Z79899 Other long term (current) drug therapy: Secondary | ICD-10-CM | POA: Diagnosis not present

## 2018-12-12 DIAGNOSIS — Z23 Encounter for immunization: Secondary | ICD-10-CM | POA: Diagnosis not present

## 2018-12-12 DIAGNOSIS — E785 Hyperlipidemia, unspecified: Secondary | ICD-10-CM | POA: Insufficient documentation

## 2018-12-12 DIAGNOSIS — N281 Cyst of kidney, acquired: Secondary | ICD-10-CM | POA: Diagnosis not present

## 2018-12-12 DIAGNOSIS — D320 Benign neoplasm of cerebral meninges: Secondary | ICD-10-CM | POA: Diagnosis not present

## 2018-12-12 DIAGNOSIS — Z01812 Encounter for preprocedural laboratory examination: Secondary | ICD-10-CM | POA: Insufficient documentation

## 2018-12-12 HISTORY — DX: Anxiety disorder, unspecified: F41.9

## 2018-12-12 HISTORY — DX: Congenital malformation of eye, unspecified: Q15.9

## 2018-12-12 LAB — CBC
HCT: 44.5 % (ref 36.0–46.0)
Hemoglobin: 14.4 g/dL (ref 12.0–15.0)
MCH: 27.7 pg (ref 26.0–34.0)
MCHC: 32.4 g/dL (ref 30.0–36.0)
MCV: 85.7 fL (ref 80.0–100.0)
Platelets: 254 10*3/uL (ref 150–400)
RBC: 5.19 MIL/uL — ABNORMAL HIGH (ref 3.87–5.11)
RDW: 13.2 % (ref 11.5–15.5)
WBC: 5.7 10*3/uL (ref 4.0–10.5)
nRBC: 0 % (ref 0.0–0.2)

## 2018-12-12 LAB — BASIC METABOLIC PANEL
Anion gap: 13 (ref 5–15)
BUN: 13 mg/dL (ref 6–20)
CO2: 29 mmol/L (ref 22–32)
Calcium: 10.1 mg/dL (ref 8.9–10.3)
Chloride: 100 mmol/L (ref 98–111)
Creatinine, Ser: 0.75 mg/dL (ref 0.44–1.00)
GFR calc Af Amer: >60 mL/min (ref >60)
GFR calc non Af Amer: >60 mL/min (ref >60)
Glucose, Bld: 146 mg/dL — ABNORMAL HIGH (ref 70–99)
Potassium: 4 mmol/L (ref 3.5–5.1)
Sodium: 142 mmol/L (ref 135–145)

## 2018-12-12 NOTE — Progress Notes (Signed)
Anesthesia Chart Review:  Case: [id number] Date/Time: 12/13/18 0915   Procedure: EXCISION SUBMANDIBULAR GLAND (Right )   Anesthesia type: General   Pre-op diagnosis: submandibular mass   Location: [facility] OR ROOM 08 / [facility]   Surgeon: Izora Gala, MD      DISCUSSION: Patient is a 58 year old female scheduled for the above procedure. She is s/p cholecystectomy 09/03/18. She has a history of trigger free anesthesia due to family history of Malignant Hyperthermia. 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   She has a family history of MALIGNANT HYPERTHEMIA (two first cousins tested positive--mother's brother's son's). Anesthesia pre-evaluation note from 12/26/12 ([place]) indicate "planned non-triggering anesthetic" given family history of Malignant Hyperthermia. She had a > 3 second pause during right craniotomy in 2014, and has history of HLD and exertional dyspnea, so EKG was done prior to her recent cholecystectomy and showed sinus bradycardia.     12/11/18 presurgical COVID test was negative. I will communicate to anesthesia team regarding FMHx of MH. Further evaluation on the day of surgery.   VS: BP 122/73   Pulse 83   Temp 36.5 C   Resp 20   Ht 5\' 3"  (1.6 m)   Wt 76.6 kg   LMP  (LMP Unknown)   SpO2  99%   BMI 29.92 kg/m     PROVIDERS: Caren Macadam, MD is PCP [facility]) - Metta Clines, DO is neurologist. - Argie Ramming, MD is Neuro-Ophthalmologist ([place]) - Also seen by Kandis Nab, MD at [facility]. She was felt clinically stable at 06/13/18 visit. She was referred to the [facility].     LABS: Labs reviewed: Acceptable for surgery. (all labs ordered are listed, but only abnormal results are displayed)  Labs Reviewed  CBC - Abnormal; Notable for the following components:      Result Value   RBC 5.19 (*)    All other components within normal limits  BASIC METABOLIC PANEL - Abnormal; Notable for the following components:   Glucose, Bld 146 (*)    All other components within normal limits     IMAGES: CT Maxillofacial 12/10/18 [facility]): FINDINGS: Osseous: Normal bones Orbits: Not visualized Sinuses: Visualized maxillary sinuses are clear. Soft tissues: Unchanged position of right submandibular gland stone measuring 2-3 mm. No left sialolithiasis. Limited intracranial: Limited visualization IMPRESSION: Unchanged position of right submandibular gland stone measuring 2-3 mm.  MRI Brain 11/22/18 ([place]): IMPRESSION: Stable right cavernous meningioma compared to 05/17/2018.   EKG: 09/03/18: Sinus bradycardia at 51 bpm  Possible Left atrial enlargement Borderline ECG No old tracing to compare Confirmed by Charolette Forward ([id number]) on 09/03/2018 6:42:23 PM   CV: N/A   Past Medical History:  Diagnosis Date  . Anxiety   . Dyspnea    occasional with exertion  . Eye abnormality    eye patch on  . Family history of adverse reaction  to anesthesia    Mal Hyperthermia - Mother's brother 2 sons tested positive  . Fibromyalgia   . Generalized arthritis   . Hashimoto's disease   . History of kidney stones    passed stone - no surgery required  . Hyperlipidemia   . Hypothyroidism    . Malignant hyperthermia    No personal history, but family history MH (cousins: maternal uncle's sons); history trigger free anesthesia 2014  . Meningioma, recurrent of brain ([place])   . Migraines   . Neurofibromatosis 2 ([place])   . PONV (postoperative nausea and vomiting)   . Seizures ([place])    last seizure April 2020 due to heat - takes keppra  . SVD (spontaneous vaginal delivery)    x 2    Past Surgical History:  Procedure Laterality Date  . BACK SURGERY    . BRAIN SURGERY     neurofibromas type 2  . CARPAL TUNNEL RELEASE    . CHOLECYSTECTOMY N/A 09/03/2018   Procedure: LAPAROSCOPIC CHOLECYSTECTOMY;  Surgeon: Clovis Riley, MD;  Location: [facility];  Service: General;  Laterality: N/A;  . COLONOSCOPY    . EYE SURGERY     lasik -bilateral, wears patch over right eye  . nerve stimulation Left    below left shoulder blade goes up tp brain -side of left neck  . SHOULDER SURGERY Left   . UPPER GI ENDOSCOPY    . WRIST ARTHROPLASTY Left     MEDICATIONS: . ARTIFICIAL TEAR SOLUTION OP  . Ascorbic Acid (VITAMIN C) 1000 MG tablet  . Cholecalciferol (VITAMIN D3) 50 MCG (2000 UT) capsule  . cloNIDine (CATAPRES) 0.2 MG tablet  . gabapentin (NEURONTIN) 100 MG capsule  . HYDROcodone-acetaminophen (NORCO/VICODIN) 5-325 MG tablet  . levETIRAcetam (KEPPRA) 500 MG tablet  . levothyroxine (SYNTHROID) 88 MCG tablet  . LORazepam (ATIVAN) 1 MG tablet  . omeprazole (PRILOSEC) 20 MG capsule  . rosuvastatin (CRESTOR) 20 MG tablet  . triamterene-hydrochlorothiazide (DYAZIDE) 37.5-25 MG capsule  . valACYclovir (VALTREX) 1000 MG tablet  . vitamin B-12 (CYANOCOBALAMIN) 1000 MCG tablet  . Zinc 50 MG TABS   No current facility-administered medications for this encounter.     Myra Gianotti, PA-C Surgical Short Stay/Anesthesiology [facility] Phone [phone] [facility] Phone [phone] 12/12/2018 4:50 PM

## 2018-12-12 NOTE — Anesthesia Preprocedure Evaluation (Addendum)
Anesthesia Evaluation  Patient identified by MRN, date of birth, ID band Patient awake    Reviewed: Allergy & Precautions, NPO status , Patient's Chart, lab work & pertinent test results  History of Anesthesia Complications (+) PONV, Family history of anesthesia reaction and history of anesthetic complications (family hx of MH)  Airway Mallampati: III  TM Distance: >3 FB Neck ROM: Full    Dental  (+) Dental Advisory Given, Teeth Intact   Pulmonary neg pulmonary ROS,    Pulmonary exam normal        Cardiovascular negative cardio ROS Normal cardiovascular exam     Neuro/Psych  Headaches, Seizures -, Poorly Controlled,  PSYCHIATRIC DISORDERS Anxiety  NF-2 Recurrent meningioma of optic sheath/brain     GI/Hepatic negative GI ROS, Neg liver ROS,   Endo/Other  Hypothyroidism   Renal/GU negative Renal ROS     Musculoskeletal  (+) Arthritis , Fibromyalgia -  Abdominal   Peds  Hematology negative hematology ROS (+)   Anesthesia Other Findings   Reproductive/Obstetrics                            Anesthesia Physical Anesthesia Plan  ASA: III  Anesthesia Plan: General   Post-op Pain Management:    Induction: Intravenous  PONV Risk Score and Plan: 4 or greater and Treatment may vary due to age or medical condition, TIVA and Ondansetron  Airway Management Planned: Oral ETT  Additional Equipment: None  Intra-op Plan:   Post-operative Plan: Extubation in OR  Informed Consent: I have reviewed the patients History and Physical, chart, labs and discussed the procedure including the risks, benefits and alternatives for the proposed anesthesia with the patient or authorized representative who has indicated his/her understanding and acceptance.     Dental advisory given  Plan Discussed with: CRNA and Anesthesiologist  Anesthesia Plan Comments: (TIVA given family hx of MH )       Anesthesia Quick Evaluation

## 2018-12-13 ENCOUNTER — Observation Stay (HOSPITAL_COMMUNITY)
Admission: RE | Admit: 2018-12-13 | Discharge: 2018-12-14 | Disposition: A | Discharge status: Home / Self Care | Payer: BC Managed Care – PPO | Attending: Otolaryngology | Admitting: Otolaryngology

## 2018-12-13 ENCOUNTER — Ambulatory Visit (HOSPITAL_COMMUNITY): Payer: BC Managed Care – PPO | Admitting: Certified Registered"

## 2018-12-13 ENCOUNTER — Encounter (HOSPITAL_COMMUNITY): Payer: Self-pay

## 2018-12-13 ENCOUNTER — Other Ambulatory Visit: Payer: Self-pay

## 2018-12-13 ENCOUNTER — Encounter (HOSPITAL_COMMUNITY)
Admission: RE | Disposition: A | Discharge status: Home / Self Care | Payer: Self-pay | Source: Home / Self Care | Attending: Otolaryngology

## 2018-12-13 DIAGNOSIS — E039 Hypothyroidism, unspecified: Secondary | ICD-10-CM | POA: Insufficient documentation

## 2018-12-13 DIAGNOSIS — Q8502 Neurofibromatosis, type 2: Secondary | ICD-10-CM | POA: Insufficient documentation

## 2018-12-13 DIAGNOSIS — R221 Localized swelling, mass and lump, neck: Secondary | ICD-10-CM | POA: Diagnosis not present

## 2018-12-13 DIAGNOSIS — K1120 Sialoadenitis, unspecified: Secondary | ICD-10-CM | POA: Insufficient documentation

## 2018-12-13 DIAGNOSIS — D72819 Decreased white blood cell count, unspecified: Secondary | ICD-10-CM | POA: Diagnosis not present

## 2018-12-13 DIAGNOSIS — Z79891 Long term (current) use of opiate analgesic: Secondary | ICD-10-CM | POA: Insufficient documentation

## 2018-12-13 DIAGNOSIS — D320 Benign neoplasm of cerebral meninges: Secondary | ICD-10-CM | POA: Diagnosis not present

## 2018-12-13 DIAGNOSIS — K115 Sialolithiasis: Secondary | ICD-10-CM | POA: Diagnosis not present

## 2018-12-13 DIAGNOSIS — D321 Benign neoplasm of spinal meninges: Secondary | ICD-10-CM | POA: Diagnosis not present

## 2018-12-13 DIAGNOSIS — Z79899 Other long term (current) drug therapy: Secondary | ICD-10-CM | POA: Diagnosis not present

## 2018-12-13 DIAGNOSIS — F419 Anxiety disorder, unspecified: Secondary | ICD-10-CM | POA: Diagnosis not present

## 2018-12-13 DIAGNOSIS — Z79890 Hormone replacement therapy (postmenopausal): Secondary | ICD-10-CM | POA: Diagnosis not present

## 2018-12-13 HISTORY — PX: SUBMANDIBULAR GLAND EXCISION: SHX2456

## 2018-12-13 SURGERY — EXCISION, SUBMANDIBULAR GLAND
Anesthesia: General | Laterality: Right

## 2018-12-13 MED ORDER — PROPOFOL 10 MG/ML IV BOLUS
INTRAVENOUS | Status: AC
  Filled 2018-12-13: qty 20

## 2018-12-13 MED ORDER — FENTANYL CITRATE (PF) 250 MCG/5ML IJ SOLN
INTRAMUSCULAR | Status: AC
  Filled 2018-12-13: qty 5

## 2018-12-13 MED ORDER — GABAPENTIN 300 MG PO CAPS
300.0000 mg | ORAL_CAPSULE | Freq: Every day | ORAL | Status: DC
  Administered 2018-12-13: 300 mg via ORAL
  Filled 2018-12-13: qty 1

## 2018-12-13 MED ORDER — LORAZEPAM 1 MG PO TABS: 1.0000 mg | ORAL_TABLET | Freq: Every day | ORAL | Status: DC | PRN

## 2018-12-13 MED ORDER — LIDOCAINE 2% (20 MG/ML) 5 ML SYRINGE
INTRAMUSCULAR | Status: DC | PRN
  Administered 2018-12-13: 60 mg via INTRAVENOUS

## 2018-12-13 MED ORDER — LIDOCAINE-EPINEPHRINE 1 %-1:100000 IJ SOLN
INTRAMUSCULAR | Status: DC | PRN
  Administered 2018-12-13: 4 mL

## 2018-12-13 MED ORDER — LEVOTHYROXINE SODIUM 88 MCG PO TABS
88.0000 ug | ORAL_TABLET | Freq: Every day | ORAL | Status: DC
  Administered 2018-12-14: 07:00:00 88 ug via ORAL
  Filled 2018-12-13: qty 1

## 2018-12-13 MED ORDER — FENTANYL CITRATE (PF) 250 MCG/5ML IJ SOLN
INTRAMUSCULAR | Status: DC | PRN
  Administered 2018-12-13: 75 ug via INTRAVENOUS
  Administered 2018-12-13 (×2): 25 ug via INTRAVENOUS

## 2018-12-13 MED ORDER — ROCURONIUM BROMIDE 10 MG/ML (PF) SYRINGE
PREFILLED_SYRINGE | INTRAVENOUS | Status: DC | PRN
  Administered 2018-12-13: 50 mg via INTRAVENOUS

## 2018-12-13 MED ORDER — ONDANSETRON HCL 4 MG/2ML IJ SOLN: 4.0000 mg | Freq: Once | INTRAMUSCULAR | Status: DC | PRN

## 2018-12-13 MED ORDER — LIDOCAINE-EPINEPHRINE 1 %-1:100000 IJ SOLN
INTRAMUSCULAR | Status: AC
  Filled 2018-12-13: qty 1

## 2018-12-13 MED ORDER — 0.9 % SODIUM CHLORIDE (POUR BTL) OPTIME
TOPICAL | Status: DC | PRN
  Administered 2018-12-13: 10:00:00 1000 mL

## 2018-12-13 MED ORDER — MIDAZOLAM HCL 2 MG/2ML IJ SOLN
INTRAMUSCULAR | Status: DC | PRN
  Administered 2018-12-13: 2 mg via INTRAVENOUS

## 2018-12-13 MED ORDER — FENTANYL CITRATE (PF) 100 MCG/2ML IJ SOLN
INTRAMUSCULAR | Status: AC
  Filled 2018-12-13: qty 2

## 2018-12-13 MED ORDER — EPHEDRINE SULFATE 50 MG/ML IJ SOLN
INTRAMUSCULAR | Status: DC | PRN
  Administered 2018-12-13: 10 mg via INTRAVENOUS

## 2018-12-13 MED ORDER — IBUPROFEN 100 MG/5ML PO SUSP
400.0000 mg | Freq: Four times a day (QID) | ORAL | Status: DC | PRN
  Administered 2018-12-14 (×2): 400 mg via ORAL
  Filled 2018-12-13 (×2): qty 20

## 2018-12-13 MED ORDER — FENTANYL CITRATE (PF) 100 MCG/2ML IJ SOLN
25.0000 ug | INTRAMUSCULAR | Status: DC | PRN
  Administered 2018-12-13 (×2): 50 ug via INTRAVENOUS
  Administered 2018-12-13: 25 ug via INTRAVENOUS

## 2018-12-13 MED ORDER — CLONIDINE HCL 0.2 MG PO TABS
0.2000 mg | ORAL_TABLET | Freq: Two times a day (BID) | ORAL | Status: DC
  Administered 2018-12-13 – 2018-12-14 (×2): 0.2 mg via ORAL
  Filled 2018-12-13 (×3): qty 1

## 2018-12-13 MED ORDER — ROSUVASTATIN CALCIUM 20 MG PO TABS
20.0000 mg | ORAL_TABLET | Freq: Every day | ORAL | Status: DC
  Administered 2018-12-13: 22:00:00 20 mg via ORAL
  Filled 2018-12-13: qty 1

## 2018-12-13 MED ORDER — PHENYLEPHRINE 40 MCG/ML (10ML) SYRINGE FOR IV PUSH (FOR BLOOD PRESSURE SUPPORT)
PREFILLED_SYRINGE | INTRAVENOUS | Status: AC
  Filled 2018-12-13: qty 10

## 2018-12-13 MED ORDER — DIPHENHYDRAMINE HCL 50 MG/ML IJ SOLN
INTRAMUSCULAR | Status: DC | PRN
  Administered 2018-12-13: 12.5 mg via INTRAVENOUS

## 2018-12-13 MED ORDER — GABAPENTIN 100 MG PO CAPS
200.0000 mg | ORAL_CAPSULE | Freq: Every morning | ORAL | Status: DC
  Administered 2018-12-14: 200 mg via ORAL
  Filled 2018-12-13 (×2): qty 2

## 2018-12-13 MED ORDER — BACITRACIN ZINC 500 UNIT/GM EX OINT
TOPICAL_OINTMENT | CUTANEOUS | Status: AC
  Filled 2018-12-13: qty 28.35

## 2018-12-13 MED ORDER — MIDAZOLAM HCL 2 MG/2ML IJ SOLN
INTRAMUSCULAR | Status: AC
  Filled 2018-12-13: qty 2

## 2018-12-13 MED ORDER — VALACYCLOVIR HCL 500 MG PO TABS
1000.0000 mg | ORAL_TABLET | Freq: Two times a day (BID) | ORAL | Status: DC | PRN
  Filled 2018-12-13: qty 2

## 2018-12-13 MED ORDER — TRIAMTERENE-HCTZ 37.5-25 MG PO TABS
1.0000 | ORAL_TABLET | Freq: Every day | ORAL | Status: DC
  Administered 2018-12-13 – 2018-12-14 (×2): 1 via ORAL
  Filled 2018-12-13 (×2): qty 1

## 2018-12-13 MED ORDER — LACTATED RINGERS IV SOLN
INTRAVENOUS | Status: DC
  Administered 2018-12-13 (×2): via INTRAVENOUS

## 2018-12-13 MED ORDER — HYDROCODONE-ACETAMINOPHEN 5-325 MG PO TABS
1.0000 | ORAL_TABLET | Freq: Four times a day (QID) | ORAL | Status: DC | PRN
  Administered 2018-12-13 – 2018-12-14 (×4): 1 via ORAL
  Filled 2018-12-13 (×4): qty 1

## 2018-12-13 MED ORDER — ZINC SULFATE 220 (50 ZN) MG PO CAPS
220.0000 mg | ORAL_CAPSULE | Freq: Every day | ORAL | Status: DC
  Administered 2018-12-13 – 2018-12-14 (×2): 220 mg via ORAL
  Filled 2018-12-13 (×2): qty 1

## 2018-12-13 MED ORDER — DEXTROSE-NACL 5-0.9 % IV SOLN
INTRAVENOUS | Status: DC
  Administered 2018-12-13: 14:00:00 via INTRAVENOUS

## 2018-12-13 MED ORDER — LEVETIRACETAM 500 MG PO TABS
500.0000 mg | ORAL_TABLET | Freq: Two times a day (BID) | ORAL | Status: DC
  Administered 2018-12-13 – 2018-12-14 (×2): 500 mg via ORAL
  Filled 2018-12-13 (×3): qty 1

## 2018-12-13 MED ORDER — DEXAMETHASONE SODIUM PHOSPHATE 10 MG/ML IJ SOLN
INTRAMUSCULAR | Status: DC | PRN
  Administered 2018-12-13: 10 mg via INTRAVENOUS

## 2018-12-13 MED ORDER — VITAMIN D3 25 MCG (1000 UNIT) PO TABS
2000.0000 [IU] | ORAL_TABLET | Freq: Every day | ORAL | Status: DC
  Administered 2018-12-13 – 2018-12-14 (×2): 2000 [IU] via ORAL
  Filled 2018-12-13 (×4): qty 2

## 2018-12-13 MED ORDER — VITAMIN C 500 MG PO TABS
1000.0000 mg | ORAL_TABLET | Freq: Every day | ORAL | Status: DC
  Administered 2018-12-13 – 2018-12-14 (×2): 1000 mg via ORAL
  Filled 2018-12-13 (×2): qty 2

## 2018-12-13 MED ORDER — PANTOPRAZOLE SODIUM 40 MG PO TBEC
40.0000 mg | DELAYED_RELEASE_TABLET | Freq: Every day | ORAL | Status: DC
  Administered 2018-12-13 – 2018-12-14 (×2): 40 mg via ORAL
  Filled 2018-12-13 (×2): qty 1

## 2018-12-13 MED ORDER — PHENYLEPHRINE HCL (PRESSORS) 10 MG/ML IV SOLN
INTRAVENOUS | Status: DC | PRN
  Administered 2018-12-13: 120 ug via INTRAVENOUS
  Administered 2018-12-13: 80 ug via INTRAVENOUS

## 2018-12-13 MED ORDER — SUGAMMADEX SODIUM 200 MG/2ML IV SOLN
INTRAVENOUS | Status: DC | PRN
  Administered 2018-12-13: 400 mg via INTRAVENOUS

## 2018-12-13 MED ORDER — VITAMIN B-12 1000 MCG PO TABS
1000.0000 ug | ORAL_TABLET | Freq: Every day | ORAL | Status: DC
  Administered 2018-12-13 – 2018-12-14 (×2): 1000 ug via ORAL
  Filled 2018-12-13 (×2): qty 1

## 2018-12-13 MED ORDER — PROPOFOL 10 MG/ML IV BOLUS
INTRAVENOUS | Status: DC | PRN
  Administered 2018-12-13: 20 mg via INTRAVENOUS
  Administered 2018-12-13: 150 mg via INTRAVENOUS

## 2018-12-13 MED ORDER — SUGAMMADEX SODIUM 200 MG/2ML IV SOLN: INTRAVENOUS | Status: DC | PRN

## 2018-12-13 MED ORDER — SENNOSIDES-DOCUSATE SODIUM 8.6-50 MG PO TABS
1.0000 | ORAL_TABLET | Freq: Two times a day (BID) | ORAL | Status: DC | PRN
  Administered 2018-12-13 – 2018-12-14 (×2): 1 via ORAL
  Filled 2018-12-13 (×2): qty 1

## 2018-12-13 MED ORDER — PROPOFOL 500 MG/50ML IV EMUL
INTRAVENOUS | Status: DC | PRN
  Administered 2018-12-13: 150 ug/kg/min via INTRAVENOUS

## 2018-12-13 MED ORDER — ONDANSETRON HCL 4 MG/2ML IJ SOLN
INTRAMUSCULAR | Status: DC | PRN
  Administered 2018-12-13: 4 mg via INTRAVENOUS

## 2018-12-13 SURGICAL SUPPLY — 53 items
ATTRACTOMAT 16X20 MAGNETIC DRP (DRAPES) IMPLANT
BLADE SURG 15 STRL LF DISP TIS (BLADE) ×1 IMPLANT
BLADE SURG 15 STRL SS (BLADE) ×1
CANISTER SUCT 3000ML PPV (MISCELLANEOUS) ×2 IMPLANT
CLEANER TIP ELECTROSURG 2X2 (MISCELLANEOUS) ×2 IMPLANT
CONT SPEC 4OZ CLIKSEAL STRL BL (MISCELLANEOUS) ×2 IMPLANT
CORD BIPOLAR FORCEPS 12FT (ELECTRODE) ×2 IMPLANT
COVER SURGICAL LIGHT HANDLE (MISCELLANEOUS) ×2 IMPLANT
COVER WAND RF STERILE (DRAPES) IMPLANT
DERMABOND ADVANCED (GAUZE/BANDAGES/DRESSINGS) ×1
DERMABOND ADVANCED .7 DNX12 (GAUZE/BANDAGES/DRESSINGS) ×1 IMPLANT
DRAIN PENROSE 1/4X12 LTX STRL (WOUND CARE) IMPLANT
DRAIN SNY 10 ROU (WOUND CARE) IMPLANT
DRAIN SNY 7 FPER (WOUND CARE) ×2 IMPLANT
DRAPE HALF SHEET 40X57 (DRAPES) IMPLANT
DRAPE SURG 17X23 STRL (DRAPES) ×2 IMPLANT
ELECT COATED BLADE 2.86 ST (ELECTRODE) ×2 IMPLANT
ELECT REM PT RETURN 9FT ADLT (ELECTROSURGICAL) ×2
ELECTRODE REM PT RTRN 9FT ADLT (ELECTROSURGICAL) ×1 IMPLANT
EVACUATOR SILICONE 100CC (DRAIN) ×2 IMPLANT
FORCEPS BIPOLAR SPETZLER 8 1.0 (NEUROSURGERY SUPPLIES) ×2 IMPLANT
GAUZE SPONGE 4X4 16PLY XRAY LF (GAUZE/BANDAGES/DRESSINGS) ×2 IMPLANT
GLOVE ECLIPSE 7.5 STRL STRAW (GLOVE) IMPLANT
GLOVE SURG SYN 7.5  E (GLOVE) ×1
GLOVE SURG SYN 7.5 E (GLOVE) ×1 IMPLANT
GLOVE SURG SYN 8.0 (GLOVE) ×2 IMPLANT
GOWN STRL REUS W/ TWL LRG LVL3 (GOWN DISPOSABLE) ×2 IMPLANT
GOWN STRL REUS W/TWL LRG LVL3 (GOWN DISPOSABLE) ×2
KIT BASIN OR (CUSTOM PROCEDURE TRAY) ×2 IMPLANT
KIT TURNOVER KIT B (KITS) ×2 IMPLANT
LOCATOR NERVE 3 VOLT (DISPOSABLE) IMPLANT
NEEDLE PRECISIONGLIDE 27X1.5 (NEEDLE) ×2 IMPLANT
NS IRRIG 1000ML POUR BTL (IV SOLUTION) ×2 IMPLANT
PAD ARMBOARD 7.5X6 YLW CONV (MISCELLANEOUS) IMPLANT
PENCIL FOOT CONTROL (ELECTRODE) ×2 IMPLANT
SHEARS HARMONIC 9CM CVD (BLADE) ×2 IMPLANT
SPECIMEN JAR SMALL (MISCELLANEOUS) IMPLANT
STAPLER VISISTAT 35W (STAPLE) ×2 IMPLANT
SUT CHROMIC 3 0 PS 2 (SUTURE) ×2 IMPLANT
SUT CHROMIC 4 0 PS 2 18 (SUTURE) ×2 IMPLANT
SUT ETHILON 2 0 FS 18 (SUTURE) IMPLANT
SUT ETHILON 3 0 PS 1 (SUTURE) ×2 IMPLANT
SUT ETHILON 5 0 P 3 18 (SUTURE)
SUT NYLON ETHILON 5-0 P-3 1X18 (SUTURE) IMPLANT
SUT SILK 2 0 REEL (SUTURE) ×2 IMPLANT
SUT SILK 2 0 SH CR/8 (SUTURE) ×2 IMPLANT
SUT SILK 3 0 REEL (SUTURE) ×2 IMPLANT
SUT SILK 4 0 REEL (SUTURE) ×2 IMPLANT
SUT VIC AB 3-0 FS2 27 (SUTURE) IMPLANT
SUT VICRYL 4-0 PS2 18IN ABS (SUTURE) IMPLANT
SYR CONTROL 10ML LL (SYRINGE) ×2 IMPLANT
TRAY ENT MC OR (CUSTOM PROCEDURE TRAY) ×2 IMPLANT
WATER STERILE IRR 1000ML POUR (IV SOLUTION) ×2 IMPLANT

## 2018-12-13 NOTE — Progress Notes (Signed)
   ENT Progress Note: s/p Procedure(s): EXCISION SUBMANDIBULAR GLAND RIGHT   Subjective: Mild discomfort, tolerating p.o. fluids  Objective: Vital signs in last 24 hours: Temp:  [97.7 F (36.5 C)-97.8 F (36.6 C)] 97.7 F (36.5 C) (10/09 1110) Pulse Rate:  [65-82] 71 (10/09 1358) Resp:  [10-20] 20 (10/09 1240) BP: (89-129)/(54-71) 106/69 (10/09 1358) SpO2:  [92 %-100 %] 100 % (10/09 1358) Weight:  [76.6 kg] 76.6 kg (10/09 0808) Weight change:  Last BM Date: 12/12/18  Intake/Output from previous day: No intake/output data recorded. Intake/Output this shift: Total I/O In: 2300 [I.V.:2300] Out: 20 [Drains:15; Blood:5]  Labs: Recent Labs    12/12/18 1528  WBC 5.7  HGB 14.4  HCT 44.5  PLT 254   Recent Labs    12/12/18 1528  NA 142  K 4.0  CL 100  CO2 29  GLUCOSE 146*  BUN 13  CALCIUM 10.1    Studies/Results: No results found.   PHYSICAL EXAM: Incision intact, no swelling or erythema. JP drain in place with appropriate output Normal nerve function   Assessment/Plan: Patient stable after right submandibular gland excision. Monitor overnight for oral intake and pain management.  Plan discharge POD 1    Jerrell Belfast 12/13/2018, 6:27 PM

## 2018-12-13 NOTE — Interval H&P Note (Signed)
History and Physical Interval Note:  12/13/2018 9:40 AM  Lisa [facility]  has presented today for surgery, with the diagnosis of submandibular mass.  The various methods of treatment have been discussed with the patient and family. After consideration of risks, benefits and other options for treatment, the patient has consented to  Procedure(s): EXCISION SUBMANDIBULAR GLAND (Right) as a surgical intervention.  The patient's history has been reviewed, patient examined, no change in status, stable for surgery.  I have reviewed the patient's chart and labs.  Questions were answered to the patient's satisfaction.     Lisa Burnett

## 2018-12-13 NOTE — Anesthesia Postprocedure Evaluation (Signed)
Anesthesia Post Note  Patient: Early Shrake  Procedure(s) Performed: EXCISION SUBMANDIBULAR GLAND RIGHT (Right )     Patient location during evaluation: PACU Anesthesia Type: General Level of consciousness: awake and alert Pain management: pain level controlled Vital Signs Assessment: post-procedure vital signs reviewed and stable Respiratory status: spontaneous breathing, nonlabored ventilation and respiratory function stable Cardiovascular status: blood pressure returned to baseline and stable Postop Assessment: no apparent nausea or vomiting Anesthetic complications: no    Last Vitals:  Vitals:   12/13/18 1155 12/13/18 1210  BP: (!) 106/58 (!) 114/59  Pulse: 69 70  Resp: 10 13  Temp:    SpO2: 100% 100%    Last Pain:  Vitals:   12/13/18 1210  PainSc: 0-No pain                 Audry Pili

## 2018-12-13 NOTE — Transfer of Care (Signed)
Immediate Anesthesia Transfer of Care Note  Patient: [name]  Procedure(s) Performed: EXCISION SUBMANDIBULAR GLAND RIGHT (Right )  Patient Location: PACU  Anesthesia Type:General  Level of Consciousness: responds to stimulation  Airway & Oxygen Therapy: Patient Spontanous Breathing and Patient connected to nasal cannula oxygen  Post-op Assessment: Report given to RN and Post -op Vital signs reviewed and stable  Post vital signs: Reviewed and stable  Last Vitals:  Vitals Value Taken Time  BP 124/68 12/13/18 1111  Temp 36.5 C 12/13/18 1110  Pulse 80 12/13/18 1114  Resp 13 12/13/18 1114  SpO2 97 % 12/13/18 1114  Vitals shown include unvalidated device data.  Last Pain:  Vitals:   12/13/18 0756  PainSc: 5          Complications: No apparent anesthesia complications

## 2018-12-13 NOTE — Op Note (Signed)
OPERATIVE REPORT  DATE OF SURGERY: 12/13/2018  PATIENT:  Lisa [facility],  58 y.o. female  PRE-OPERATIVE DIAGNOSIS:  Submandibular calculus right   POST-OPERATIVE DIAGNOSIS:  Submandibular calculus right   PROCEDURE:  Procedure(s): EXCISION SUBMANDIBULAR GLAND RIGHT  SURGEON:  Beckie Salts, MD  ASSISTANTS: Jodi Marble MD  ANESTHESIA:   General   EBL: 50 ml  DRAINS: 10 French round JP  LOCAL MEDICATIONS USED: 1% Xylocaine with epinephrine  SPECIMEN: Right submandibular gland with associated calculus  COUNTS:  Correct  PROCEDURE DETAILS: The patient was taken to the operating room and placed on the operating table in the supine position. Following induction of general endotracheal anesthesia, the right neck was prepped and draped in a standard fashion.  A cervical skin crease was used to place the incision about 2 fingerbreadths below the angle of the mandible.  This was marked with a marking pen and infiltrated with local anesthetic solution.  A #15 scalpel was used to incise the skin and subcutaneous tissue.  Cautery dissection was used to dissect through the platysma and a subplatysmal flap was developed superiorly up towards the mandible.  A self-retaining retractor was used throughout the case.  The facial vessels were separately identified low and ligated between clamps and divided.  4-0 silk ties were used.  These were brought up superiorly towards the mandible with associated fibrofatty tissue presumably containing the mandibular branch of the facial nerve.  The gland was then exposed.  The gland was dissected off of the mylohyoid muscle.  The sublingual ganglion was identified and divided.  The duct was identified and divided as well.  The calculus was identified and kept on the specimen side when the duct was transected.  4-0 silk ties were used for all of these structures.  Facial artery was identified and ligated between clamps and divided.  The digastric muscle was  preserved.  The hypoglossal nerve was identified and preserved.  The specimen was removed and sent for pathologic evaluation.  Hemostasis was completed using electrocautery as needed.  Bipolar cautery was used as needed as well.  The harmonic dissector was used for part of the dissection of soft tissue.    The wound was irrigated with saline.  The drain was exited through a separate stab incision and secured in place with nylon suture.  The platysmal layer was reapproximated with interrupted 3-0 chromic suture.  4-0 running chromic was used in a subcuticular fashion and Dermabond on the skin.  The drain was charged.  The patient was awakened extubated and transferred to recovery in stable condition.    PATIENT DISPOSITION:  To PACU, stable

## 2018-12-13 NOTE — Anesthesia Procedure Notes (Signed)

## 2018-12-14 ENCOUNTER — Encounter (HOSPITAL_COMMUNITY): Payer: Self-pay | Admitting: Otolaryngology

## 2018-12-14 DIAGNOSIS — F419 Anxiety disorder, unspecified: Secondary | ICD-10-CM | POA: Diagnosis not present

## 2018-12-14 DIAGNOSIS — Z79891 Long term (current) use of opiate analgesic: Secondary | ICD-10-CM | POA: Diagnosis not present

## 2018-12-14 DIAGNOSIS — Z1159 Encounter for screening for other viral diseases: Secondary | ICD-10-CM | POA: Diagnosis not present

## 2018-12-14 DIAGNOSIS — Z79890 Hormone replacement therapy (postmenopausal): Secondary | ICD-10-CM | POA: Diagnosis not present

## 2018-12-14 DIAGNOSIS — Q8502 Neurofibromatosis, type 2: Secondary | ICD-10-CM | POA: Diagnosis not present

## 2018-12-14 DIAGNOSIS — K115 Sialolithiasis: Secondary | ICD-10-CM | POA: Diagnosis not present

## 2018-12-14 DIAGNOSIS — R221 Localized swelling, mass and lump, neck: Secondary | ICD-10-CM | POA: Diagnosis not present

## 2018-12-14 DIAGNOSIS — K1120 Sialoadenitis, unspecified: Secondary | ICD-10-CM | POA: Diagnosis not present

## 2018-12-14 DIAGNOSIS — E039 Hypothyroidism, unspecified: Secondary | ICD-10-CM | POA: Diagnosis not present

## 2018-12-14 DIAGNOSIS — Z79899 Other long term (current) drug therapy: Secondary | ICD-10-CM | POA: Diagnosis not present

## 2018-12-14 DIAGNOSIS — Z20828 Contact with and (suspected) exposure to other viral communicable diseases: Secondary | ICD-10-CM | POA: Diagnosis not present

## 2018-12-14 NOTE — Progress Notes (Signed)
Patient discharged to home. Verbalized understanding of all discharge instructions including incision care, discharge medications and follow up MD visits. 

## 2018-12-14 NOTE — Progress Notes (Signed)
   ENT Progress Note: POD #1 s/p Procedure(s): EXCISION SUBMANDIBULAR GLAND RIGHT   Subjective: Patient tolerating normal oral intake.  Complaining of mild discomfort.  Objective: Vital signs in last 24 hours: Temp:  [97.7 F (36.5 C)-98.5 F (36.9 C)] 97.7 F (36.5 C) (10/10 0556) Pulse Rate:  [64-82] 72 (10/10 0556) Resp:  [10-20] 18 (10/10 0556) BP: (89-129)/(54-77) 106/64 (10/10 0556) SpO2:  [92 %-100 %] 98 % (10/10 0556) Weight change:  Last BM Date: 12/12/18  Intake/Output from previous day: 10/09 0701 - 10/10 0700 In: 3136 [P.O.:236; I.V.:2900] Out: 30 [Drains:25; Blood:5] Intake/Output this shift: No intake/output data recorded.  Labs: Recent Labs    12/12/18 1528  WBC 5.7  HGB 14.4  HCT 44.5  PLT 254   Recent Labs    12/12/18 1528  NA 142  K 4.0  CL 100  CO2 29  GLUCOSE 146*  BUN 13  CALCIUM 10.1    Studies/Results: No results found.   PHYSICAL EXAM: Incision intact without erythema or swelling. JP drain removed without difficulty. Neurologically intact.   Assessment/Plan: Patient stable after excision right submandibular gland.  Drain removed, patient discharged to home.    Jerrell Belfast 12/14/2018, 9:06 AM

## 2018-12-14 NOTE — Discharge Instructions (Signed)
You may shower and use soap and water. Do not use any creams, oils or ointment. ° °

## 2018-12-14 NOTE — Plan of Care (Signed)

## 2018-12-14 NOTE — Discharge Summary (Signed)
Physician Discharge Summary  Patient ID: Lisa Burnett MRN: [id number] DOB/AGE: [id number] 56 y.o.  Admit date: 12/13/2018 Discharge date: 12/14/2018  Admission Diagnoses:  Active Problems:   Sialolithiasis of submandibular gland   Discharge Diagnoses:  Same  Surgeries: Procedure(s): EXCISION SUBMANDIBULAR GLAND RIGHT on 12/13/2018   Consultants: None  Discharged Condition: Improved  Hospital Course: Lisa Burnett is an 58 y.o. female who was admitted 12/13/2018 with a diagnosis of Active Problems:   Sialolithiasis of submandibular gland  and went to the operating room on 12/13/2018 and underwent the above named procedures.   Patient stable on postoperative day 1, drain removed without difficulty.  Discharge to home.  Recent vital signs:  Vitals:   12/14/18 0017 12/14/18 0556  BP: 96/77 106/64  Pulse: 74 72  Resp:  18  Temp: 98.5 F (36.9 C) 97.7 F (36.5 C)  SpO2: 98% 98%    Recent laboratory studies:  Results for orders placed or performed during the hospital encounter of 12/12/18  CBC  Result Value Ref Range   WBC 5.7 4.0 - 10.5 K/uL   RBC 5.19 (H) 3.87 - 5.11 MIL/uL   Hemoglobin 14.4 12.0 - 15.0 g/dL   HCT 44.5 36.0 - 46.0 %   MCV 85.7 80.0 - 100.0 fL   MCH 27.7 26.0 - 34.0 pg   MCHC 32.4 30.0 - 36.0 g/dL   RDW 13.2 11.5 - 15.5 %   Platelets 254 150 - 400 K/uL   nRBC 0.0 0.0 - 0.2 %  Basic metabolic panel  Result Value Ref Range   Sodium 142 135 - 145 mmol/L   Potassium 4.0 3.5 - 5.1 mmol/L   Chloride 100 98 - 111 mmol/L   CO2 29 22 - 32 mmol/L   Glucose, Bld 146 (H) 70 - 99 mg/dL   BUN 13 6 - 20 mg/dL   Creatinine, Ser 0.75 0.44 - 1.00 mg/dL   Calcium 10.1 8.9 - 10.3 mg/dL   GFR calc non Af Amer >60 >60 mL/min   GFR calc Af Amer >60 >60 mL/min   Anion gap 13 5 - 15    Discharge Medications:   Allergies as of 12/14/2018      Reactions   Cinnamon Anaphylaxis   Clonazepam Rash   [name]   Hydroxychloroquine    [name]    Penicillins Anaphylaxis, Hives   Did it involve swelling of the face/tongue/throat, SOB, or low BP? Yes Did it involve sudden or severe rash/hives, skin peeling, or any reaction on the inside of your mouth or nose? Yes Did you need to seek medical attention at a hospital or doctor's office? Yes When did it last happen?10 + years If all above answers are "NO", may proceed with cephalosporin use.   Sulfa Antibiotics Anaphylaxis, Hives, Rash, Other (See Comments)      Latex Rash   Baclofen    Caused seizures    Oxycodone    Felt weird    Tape Rash   With blisters Also  Scopolamine patches      Medication List    TAKE these medications   ARTIFICIAL TEAR SOLUTION OP Place 1 drop into both eyes 2 (two) times a day.   cloNIDine 0.2 MG tablet Commonly known as: CATAPRES Take 0.2 mg by mouth 2 (two) times daily.   gabapentin 100 MG capsule Commonly known as: NEURONTIN Take 200-300 mg by mouth See admin instructions. Take 200 mg in the morning and 300 mg at night   HYDROcodone-acetaminophen 5-325  MG tablet Commonly known as: NORCO/VICODIN Take 1 tablet by mouth every 6 (six) hours as needed for moderate pain or severe pain.   levETIRAcetam 500 MG tablet Commonly known as: KEPPRA Take 500 mg by mouth 2 (two) times daily.   levothyroxine 88 MCG tablet Commonly known as: SYNTHROID Take 88 mcg by mouth daily before breakfast.   LORazepam 1 MG tablet Commonly known as: ATIVAN Take 1 mg by mouth daily as needed for seizure or sleep.   omeprazole 20 MG capsule Commonly known as: PRILOSEC Take 20 mg by mouth at bedtime as needed (acid reflux).   rosuvastatin 20 MG tablet Commonly known as: CRESTOR Take 20 mg by mouth at bedtime.   triamterene-hydrochlorothiazide 37.5-25 MG capsule Commonly known as: DYAZIDE Take 1 capsule by mouth daily.   valACYclovir 1000 MG tablet Commonly known as: VALTREX Take 1,000 mg by mouth every 12 (twelve) hours as needed (fever  blisters).   vitamin B-12 1000 MCG tablet Commonly known as: CYANOCOBALAMIN Take 1,000 mcg by mouth daily.   vitamin C 1000 MG tablet Take 1,000 mg by mouth daily.   Vitamin D3 50 MCG (2000 UT) capsule Take 2,000 Units by mouth daily.   Zinc 50 MG Tabs Take 50 mg by mouth daily.       Diagnostic Studies: No results found.  Disposition: Discharge disposition: 01-Home or Self Care       Discharge Instructions    Diet - low sodium heart healthy   Complete by: As directed    Discharge instructions   Complete by: As directed    Patient may use over-the-counter laxative as needed.   Increase activity slowly   Complete by: As directed       Follow-up Information    Izora Gala, MD. Schedule an appointment as soon as possible for a visit in 1 week.   Specialty: Otolaryngology Contact information: [address] [place] [address] [phone]            Signed: Jerrell Belfast 12/14/2018, 9:09 AM

## 2018-12-16 DIAGNOSIS — G8918 Other acute postprocedural pain: Secondary | ICD-10-CM | POA: Diagnosis not present

## 2018-12-16 DIAGNOSIS — G40109 Localization-related (focal) (partial) symptomatic epilepsy and epileptic syndromes with simple partial seizures, not intractable, without status epilepticus: Secondary | ICD-10-CM | POA: Diagnosis not present

## 2018-12-16 DIAGNOSIS — Z79899 Other long term (current) drug therapy: Secondary | ICD-10-CM | POA: Diagnosis not present

## 2018-12-16 DIAGNOSIS — G5602 Carpal tunnel syndrome, left upper limb: Secondary | ICD-10-CM | POA: Diagnosis not present

## 2018-12-16 DIAGNOSIS — Z9689 Presence of other specified functional implants: Secondary | ICD-10-CM | POA: Diagnosis not present

## 2018-12-16 DIAGNOSIS — G5622 Lesion of ulnar nerve, left upper limb: Secondary | ICD-10-CM | POA: Diagnosis not present

## 2018-12-16 DIAGNOSIS — M79642 Pain in left hand: Secondary | ICD-10-CM | POA: Diagnosis not present

## 2018-12-16 DIAGNOSIS — E039 Hypothyroidism, unspecified: Secondary | ICD-10-CM | POA: Diagnosis not present

## 2018-12-16 DIAGNOSIS — K219 Gastro-esophageal reflux disease without esophagitis: Secondary | ICD-10-CM | POA: Diagnosis not present

## 2018-12-19 LAB — SURGICAL PATHOLOGY

## 2019-02-26 ENCOUNTER — Ambulatory Visit (INDEPENDENT_AMBULATORY_CARE_PROVIDER_SITE_OTHER): Payer: PRIVATE HEALTH INSURANCE | Admitting: Neurology

## 2019-02-26 ENCOUNTER — Other Ambulatory Visit: Payer: Self-pay

## 2019-02-26 DIAGNOSIS — G43709 Chronic migraine without aura, not intractable, without status migrainosus: Secondary | ICD-10-CM

## 2019-02-26 MED ORDER — ONABOTULINUMTOXINA 100 UNITS IJ SOLR
200.0000 [IU] | Freq: Once | INTRAMUSCULAR | Status: AC
  Administered 2019-02-26: 177.5 [IU] via INTRAMUSCULAR

## 2019-02-26 NOTE — Progress Notes (Signed)
[facility]   Procedure Note Botox  Attending: Dr. Metta Clines  Preoperative Diagnosis(es): Chronic migraine  Consent obtained from: Patient Benefits discussed included, but were not limited to decreased muscle tightness, increased joint range of motion, and decreased pain.  Risk discussed included, but were not limited pain and discomfort, bleeding, bruising, excessive weakness, venous thrombosis, muscle atrophy and dysphagia.  Anticipated outcomes of the procedure as well as he risks and benefits of the alternatives to the procedure, and the roles and tasks of the personnel to be involved, were discussed with the patient, and the patient consents to the procedure and agrees to proceed. A copy of the patient medication guide was given to the patient which explains the blackbox warning.  Patients identity and treatment sites confirmed:  Yes.  Details of Procedure: Skin was cleaned with alcohol. Prior to injection, the needle plunger was aspirated to make sure the needle was not within a blood vessel.  There was no blood retrieved on aspiration.    Following is a summary of the muscles injected  And the amount of Botulinum toxin used:  Dilution 200 units of Botox was reconstituted with 4 ml of preservative free normal saline. Time of reconstitution: At the time of the office visit (<30 minutes prior to injection)   Injections  177.5 total units of Botox was injected with a 30 gauge needle.  Injection Sites: L occipitalis: 15 units- 3 sites  R occiptalis: 15 units- 3 sites  L upper trapezius: 15 units- 3 sites R upper trapezius: 15 units- 3 sits          L paraspinal: 10 units- 2 sites R paraspinal: 10 units- 2 sites  Face L frontalis(2 injection sites):10 units   R frontalis(2 injection sites):10 units         L corrugator: 5 units   R corrugator: 5 units           Procerus: 7.5 units   L temporalis: 30 units R temporalis: 30 units   Agent:  200 units of botulinum Type  A (Onobotulinum Toxin type A) was reconstituted with 4 ml of preservative free normal saline.  Time of reconstitution: At the time of the office visit (<30 minutes prior to injection)     Total injected (Units): 177.5  Total wasted (Units): 22.5  Patient tolerated procedure well without complications.   Reinjection is anticipated in 3 months.

## 2019-06-10 ENCOUNTER — Encounter: Payer: Self-pay | Admitting: *Deleted

## 2019-06-10 ENCOUNTER — Telehealth: Payer: Self-pay | Admitting: Neurology

## 2019-06-10 NOTE — Telephone Encounter (Signed)
Pt called and states that she now has Medicare part B and has a Botox sch for 06-13-19.  She states that medicare should be primary and the priority health is 2nd  Not sure if we need auth for Botox for Medicare. She is still waiting on the card medicare she just got it

## 2019-06-10 NOTE — Progress Notes (Signed)
Benefit verification through [facility] #  [id number]  BV Submitted Date 06/10/2019 BV Completed Date  BV Status  In Progress  Action Item None

## 2019-06-13 ENCOUNTER — Other Ambulatory Visit: Payer: Self-pay

## 2019-06-13 ENCOUNTER — Ambulatory Visit (INDEPENDENT_AMBULATORY_CARE_PROVIDER_SITE_OTHER): Payer: Medicare Other | Admitting: Neurology

## 2019-06-13 DIAGNOSIS — G43709 Chronic migraine without aura, not intractable, without status migrainosus: Secondary | ICD-10-CM

## 2019-06-13 MED ORDER — ONABOTULINUMTOXINA 100 UNITS IJ SOLR
100.0000 [IU] | Freq: Once | INTRAMUSCULAR | Status: AC
  Administered 2019-06-13: 12:00:00 78 [IU] via INTRAMUSCULAR

## 2019-06-13 MED ORDER — ONABOTULINUMTOXINA 100 UNITS IJ SOLR
100.0000 [IU] | Freq: Once | INTRAMUSCULAR | Status: AC
  Administered 2019-06-13: 100 [IU] via INTRAMUSCULAR

## 2019-06-13 NOTE — Progress Notes (Signed)
[facility]   Procedure Note Botox  Attending: Dr. Metta Clines  Preoperative Diagnosis(es): Chronic migraine  Consent obtained from: The patient Benefits discussed included, but were not limited to decreased muscle tightness, increased joint range of motion, and decreased pain.  Risk discussed included, but were not limited pain and discomfort, bleeding, bruising, excessive weakness, venous thrombosis, muscle atrophy and dysphagia.  Anticipated outcomes of the procedure as well as he risks and benefits of the alternatives to the procedure, and the roles and tasks of the personnel to be involved, were discussed with the patient, and the patient consents to the procedure and agrees to proceed. A copy of the patient medication guide was given to the patient which explains the blackbox warning.  Patients identity and treatment sites confirmed Yes.  .  Details of Procedure: Skin was cleaned with alcohol. Prior to injection, the needle plunger was aspirated to make sure the needle was not within a blood vessel.  There was no blood retrieved on aspiration.    Following is a summary of the muscles injected  And the amount of Botulinum toxin used:  Dilution 200 units of Botox was reconstituted with 4 ml of preservative free normal saline. Time of reconstitution: At the time of the office visit (<30 minutes prior to injection)   Injections  155 total units of Botox was injected with a 30 gauge needle.  Injection Sites: L occipitalis: 15 units- 3 sites  R occiptalis: 15 units- 3 sites  L upper trapezius: 15 units- 3 sites R upper trapezius: 15 units- 3 sits          L paraspinal: 10 units- 2 sites R paraspinal: 10 units- 2 sites  Face L frontalis(2 injection sites):10 units   R frontalis(2 injection sites):10 units         L corrugator: 5 units   R corrugator: 5 units           Procerus: 7.5 units   L temporalis: 30 units R temporalis: 30 units   Agent:  200 units of botulinum  Type A (Onobotulinum Toxin type A) was reconstituted with 4 ml of preservative free normal saline.  Time of reconstitution: At the time of the office visit (<30 minutes prior to injection)     Total injected (Units): 177.5  Total wasted (Units): 22.5  Patient tolerated procedure well without complications.   Reinjection is anticipated in 3 months.

## 2019-06-27 ENCOUNTER — Other Ambulatory Visit: Payer: Self-pay | Admitting: Obstetrics & Gynecology

## 2019-06-27 ENCOUNTER — Other Ambulatory Visit: Payer: Self-pay | Admitting: Family Medicine

## 2019-06-27 DIAGNOSIS — Z1231 Encounter for screening mammogram for malignant neoplasm of breast: Secondary | ICD-10-CM

## 2019-07-02 ENCOUNTER — Other Ambulatory Visit: Payer: Self-pay

## 2019-07-02 ENCOUNTER — Ambulatory Visit
Admission: RE | Admit: 2019-07-02 | Discharge: 2019-07-02 | Disposition: A | Discharge status: Home / Self Care | Payer: Medicare Other | Source: Ambulatory Visit | Attending: Obstetrics & Gynecology | Admitting: Obstetrics & Gynecology

## 2019-07-02 DIAGNOSIS — Z1231 Encounter for screening mammogram for malignant neoplasm of breast: Secondary | ICD-10-CM

## 2019-09-12 ENCOUNTER — Other Ambulatory Visit: Payer: Self-pay

## 2019-09-12 ENCOUNTER — Ambulatory Visit (INDEPENDENT_AMBULATORY_CARE_PROVIDER_SITE_OTHER): Payer: Medicare Other | Admitting: Neurology

## 2019-09-12 DIAGNOSIS — G43709 Chronic migraine without aura, not intractable, without status migrainosus: Secondary | ICD-10-CM

## 2019-09-12 MED ORDER — ONABOTULINUMTOXINA 100 UNITS IJ SOLR
183.0000 [IU] | Freq: Once | INTRAMUSCULAR | Status: AC
  Administered 2019-09-12: 178 [IU] via INTRAMUSCULAR

## 2019-09-12 NOTE — Progress Notes (Signed)
[facility]   Procedure Note Botox  Attending: Dr. Metta Clines  Preoperative Diagnosis(es): Chronic migraine  Consent obtained from: Patient Benefits discussed included, but were not limited to decreased muscle tightness, increased joint range of motion, and decreased pain.  Risk discussed included, but were not limited pain and discomfort, bleeding, bruising, excessive weakness, venous thrombosis, muscle atrophy and dysphagia.  Anticipated outcomes of the procedure as well as he risks and benefits of the alternatives to the procedure, and the roles and tasks of the personnel to be involved, were discussed with the patient, and the patient consents to the procedure and agrees to proceed. A copy of the patient medication guide was given to the patient which explains the blackbox warning.  Patients identity and treatment sites confirmed:  Yes  Details of Procedure: Skin was cleaned with alcohol. Prior to injection, the needle plunger was aspirated to make sure the needle was not within a blood vessel.  There was no blood retrieved on aspiration.    Following is a summary of the muscles injected  And the amount of Botulinum toxin used:  Dilution 200 units of Botox was reconstituted with 4 ml of preservative free normal saline. Time of reconstitution: At the time of the office visit (<30 minutes prior to injection)   Injections  155 total units of Botox was injected with a 30 gauge needle.  Injection Sites: L occipitalis: 15 units- 3 sites  R occiptalis: 15 units- 3 sites  L upper trapezius: 15 units- 3 sites R upper trapezius: 15 units- 3 sits          L paraspinal: 10 units- 2 sites R paraspinal: 10 units- 2 sites  Face L frontalis(2 injection sites):10 units   R frontalis(2 injection sites):10 units         L corrugator: 5 units   R corrugator: 5 units           Procerus: 7.5 units   L temporalis: 30 units R temporalis: 30 units   Agent:  200 units of botulinum Type A  (Onobotulinum Toxin type A) was reconstituted with 4 ml of preservative free normal saline.  Time of reconstitution: At the time of the office visit (<30 minutes prior to injection)     Total injected (Units): 177.5  Total wasted (Units): 5  Patient tolerated procedure well without complications.   Reinjection is anticipated in 3 months. Return to clinic in 6 weeks

## 2019-09-15 ENCOUNTER — Telehealth: Payer: Self-pay | Admitting: Ophthalmology

## 2019-09-15 NOTE — Telephone Encounter (Signed)
megan with [facility] states requesting copy of most recent prescription for glasses. Please contact megan to assist. Thank you.

## 2019-12-12 ENCOUNTER — Other Ambulatory Visit: Payer: Self-pay

## 2019-12-12 ENCOUNTER — Ambulatory Visit (INDEPENDENT_AMBULATORY_CARE_PROVIDER_SITE_OTHER): Payer: Medicare HMO | Admitting: Neurology

## 2019-12-12 DIAGNOSIS — G43709 Chronic migraine without aura, not intractable, without status migrainosus: Secondary | ICD-10-CM

## 2019-12-12 MED ORDER — ONABOTULINUMTOXINA 100 UNITS IJ SOLR
180.0000 [IU] | Freq: Once | INTRAMUSCULAR | Status: AC
  Administered 2019-12-12: 177.5 [IU] via INTRAMUSCULAR

## 2019-12-12 NOTE — Progress Notes (Signed)
[facility]   Procedure Note Botox  Attending: Dr. Metta Clines  Preoperative Diagnosis(es): Chronic migraine  Consent obtained from: The patient Benefits discussed included, but were not limited to decreased muscle tightness, increased joint range of motion, and decreased pain.  Risk discussed included, but were not limited pain and discomfort, bleeding, bruising, excessive weakness, venous thrombosis, muscle atrophy and dysphagia.  Anticipated outcomes of the procedure as well as he risks and benefits of the alternatives to the procedure, and the roles and tasks of the personnel to be involved, were discussed with the patient, and the patient consents to the procedure and agrees to proceed. A copy of the patient medication guide was given to the patient which explains the blackbox warning.  Patients identity and treatment sites confirmed Yes.  .  Details of Procedure: Skin was cleaned with alcohol. Prior to injection, the needle plunger was aspirated to make sure the needle was not within a blood vessel.  There was no blood retrieved on aspiration.    Following is a summary of the muscles injected  And the amount of Botulinum toxin used:  Dilution 200 units of Botox was reconstituted with 4 ml of preservative free normal saline. Time of reconstitution: At the time of the office visit (<30 minutes prior to injection)   Injections  155 total units of Botox was injected with a 30 gauge needle.  Injection Sites: L occipitalis: 15 units- 3 sites  R occiptalis: 15 units- 3 sites  L upper trapezius: 15 units- 3 sites R upper trapezius: 15 units- 3 sits          L paraspinal: 10 units- 2 sites R paraspinal: 10 units- 2 sites  Face L frontalis(2 injection sites):10 units   R frontalis(2 injection sites):10 units         L corrugator: 5 units   R corrugator: 5 units           Procerus: 7.5 units   L temporalis: 30 units R temporalis: 30 units   Agent:  200 units of botulinum  Type A (Onobotulinum Toxin type A) was reconstituted with 4 ml of preservative free normal saline.  Time of reconstitution: At the time of the office visit (<30 minutes prior to injection)     Total injected (Units): 177.5  Total wasted (Units): 2.5  Patient tolerated procedure well without complications.   Reinjection is anticipated in 3 months.

## 2020-03-09 ENCOUNTER — Encounter: Payer: Self-pay | Admitting: Neurology

## 2020-03-09 NOTE — Progress Notes (Addendum)
Submitted BV through [facility] for investigation.   BV states patient requires PA and has [facility] as her specialty pharmacy. 1/26- set up acct and gave verbal for botox with [facility].

## 2020-03-12 ENCOUNTER — Other Ambulatory Visit: Payer: Self-pay

## 2020-03-12 ENCOUNTER — Ambulatory Visit: Payer: Medicare HMO | Admitting: Neurology

## 2020-03-12 DIAGNOSIS — G43709 Chronic migraine without aura, not intractable, without status migrainosus: Secondary | ICD-10-CM

## 2020-03-12 MED ORDER — ONABOTULINUMTOXINA 100 UNITS IJ SOLR
185.0000 [IU] | Freq: Once | INTRAMUSCULAR | Status: AC
  Administered 2020-03-12: 178 [IU] via INTRAMUSCULAR

## 2020-03-12 NOTE — Progress Notes (Signed)
[facility]   Procedure Note Botox  Attending: Dr. Metta Clines  Preoperative Diagnosis(es): Chronic migraine  Consent obtained from: The patient. Benefits discussed included, but were not limited to decreased muscle tightness, increased joint range of motion, and decreased pain.  Risk discussed included, but were not limited pain and discomfort, bleeding, bruising, excessive weakness, venous thrombosis, muscle atrophy and dysphagia.  Anticipated outcomes of the procedure as well as he risks and benefits of the alternatives to the procedure, and the roles and tasks of the personnel to be involved, were discussed with the patient, and the patient consents to the procedure and agrees to proceed. A copy of the patient medication guide was given to the patient which explains the blackbox warning.  Patients identity and treatment sites confirmed Yes.  Details of Procedure: Skin was cleaned with alcohol. Prior to injection, the needle plunger was aspirated to make sure the needle was not within a blood vessel.  There was no blood retrieved on aspiration.    Following is a summary of the muscles injected  And the amount of Botulinum toxin used:  Dilution 200 units of Botox was reconstituted with 4 ml of preservative free normal saline. Time of reconstitution: At the time of the office visit (<30 minutes prior to injection)   Injections  155 total units of Botox was injected with a 30 gauge needle.  Injection Sites: L occipitalis: 15 units- 3 sites  R occiptalis: 15 units- 3 sites  L upper trapezius: 15 units- 3 sites R upper trapezius: 15 units- 3 sits          L paraspinal: 10 units- 2 sites R paraspinal: 10 units- 2 sites  Face L frontalis(2 injection sites):10 units   R frontalis(2 injection sites):10 units         L corrugator: 5 units   R corrugator: 5 units           Procerus: 7.5 units   L temporalis: 30 units R temporalis: 30 units   Agent:  200 units of botulinum Type  A (Onobotulinum Toxin type A) was reconstituted with 4 ml of preservative free normal saline.  Time of reconstitution: At the time of the office visit (<30 minutes prior to injection)     Total injected (Units): 177.5  Total wasted (Units): 7  Patient tolerated procedure well without complications.   Reinjection is anticipated in 3 months.

## 2020-04-07 NOTE — Progress Notes (Signed)
Lisa Burnett Key: [id number] - PA Case ID: [id number] - Rx #: [id number] Need help? Call us at [phone] Outcome Approvedon February 1 PA Case: [id number], Status: Approved, Coverage Starts on: 03/06/2020 12:00:00 AM, Coverage Ends on: 03/05/2021 12:00:00 AM. Questions? Contact [phone]. Drug Botox 200UNIT solution Form Nurse, adult and Medical Benefit PA Form

## 2020-04-27 ENCOUNTER — Telehealth: Payer: Self-pay | Admitting: Neurology

## 2020-04-27 NOTE — Telephone Encounter (Signed)
Patient has questions about her Botox and the copay and delivery please call

## 2020-04-27 NOTE — Telephone Encounter (Signed)
LMOM with patient to call us back

## 2020-05-11 ENCOUNTER — Ambulatory Visit: Payer: Medicare HMO | Admitting: Neurology

## 2020-06-11 ENCOUNTER — Ambulatory Visit: Payer: Medicare HMO | Admitting: Neurology

## 2020-09-30 IMAGING — CT CT THORACIC SPINE WITHOUT CONTRAST
1 series · 12 of 14 positions shown, 15 images · non-contrast
Comparison: None.

CLINICAL DATA: T-spine pain eval for hardware issues, 1 year ago
fusion

EXAM:
CT THORACIC SPINE WITHOUT CONTRAST
TECHNIQUE: Multidetector CT images of the thoracic were obtained using the
standard protocol without intravenous contrast.

[Series 3: t spine soft · axial · 0.40mm/px · z∈[-398,-119]mm · 12 of 111 slices shown, 15 images]
[im 9/111  soft-tissue]
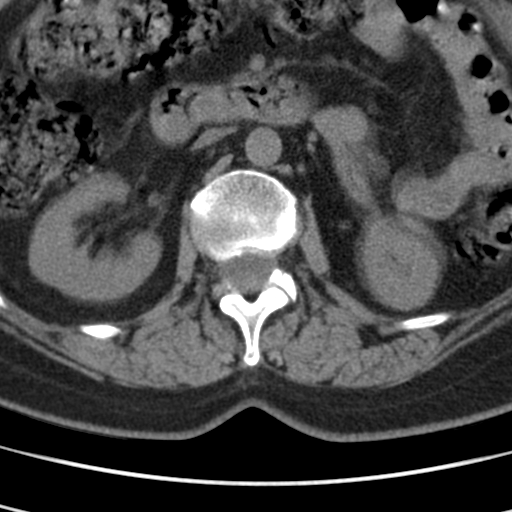
[im 9/111  bone]
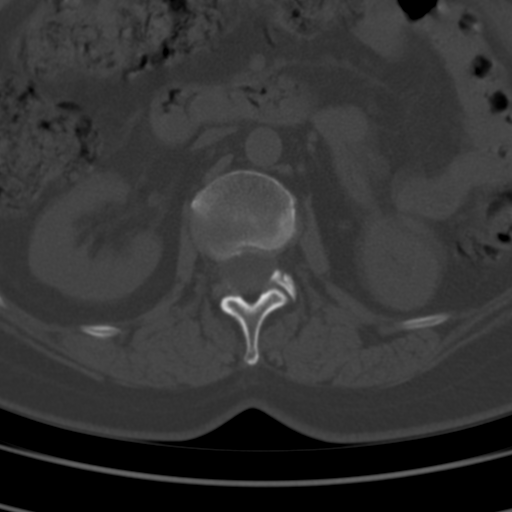
[im 17/111  bone]
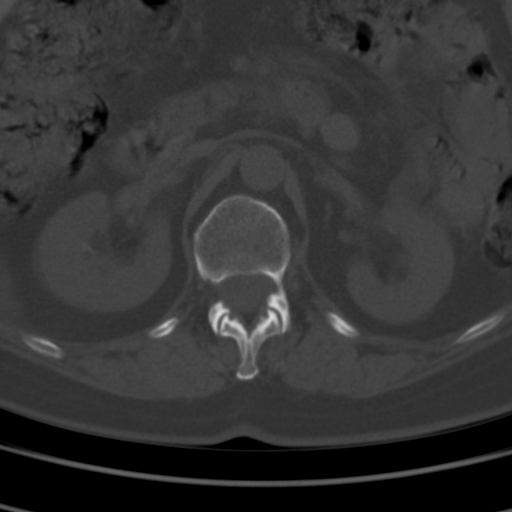
[im 26/111  bone]
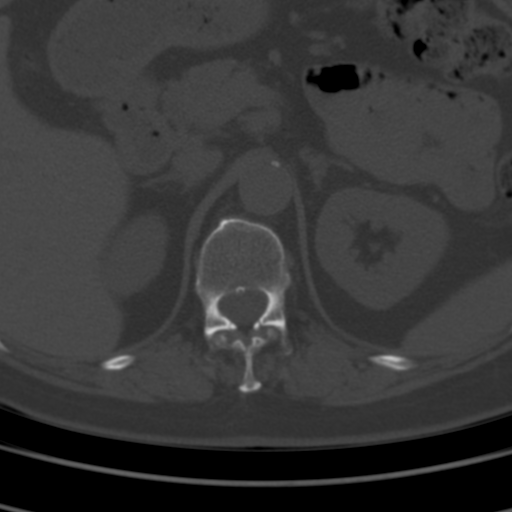
[im 34/111  bone]
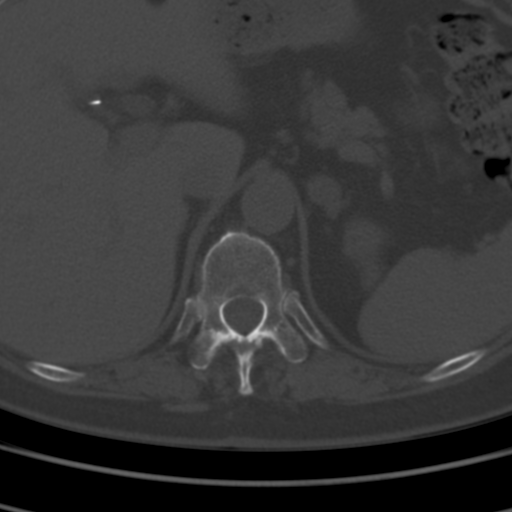
[im 43/111  soft-tissue]
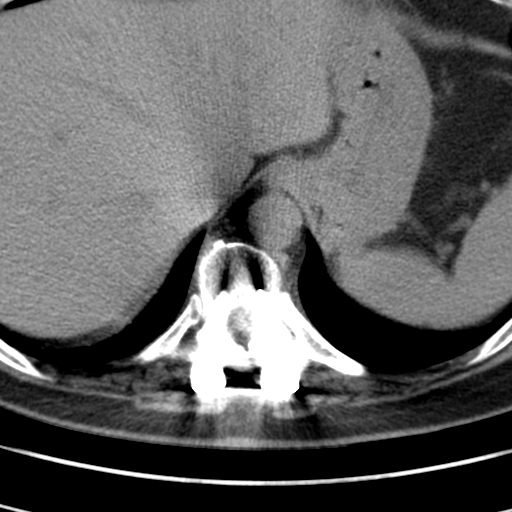
[im 43/111  bone]
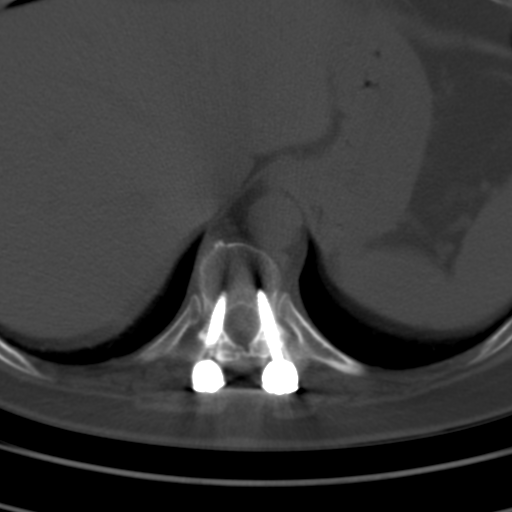
[im 51/111  bone]
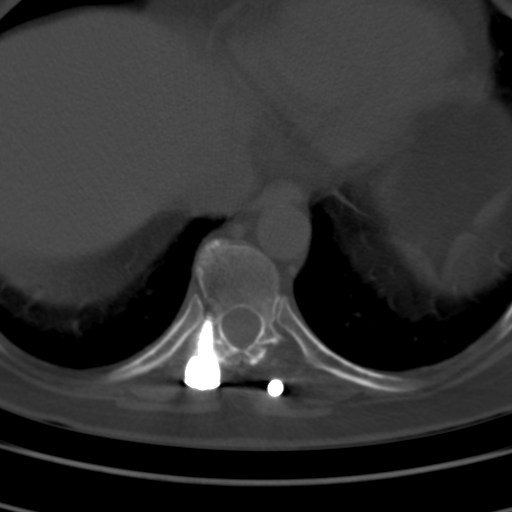
[im 60/111  bone]
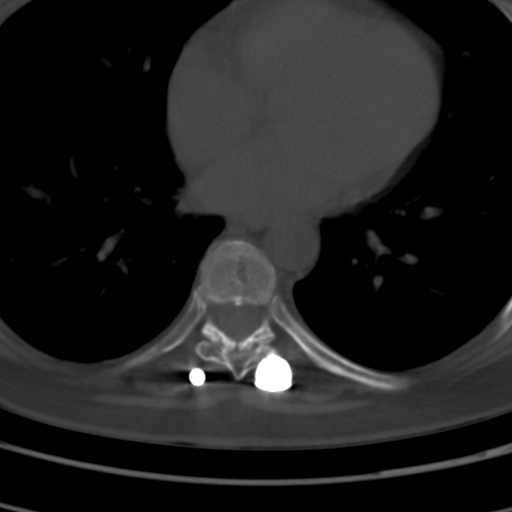
[im 68/111  bone]
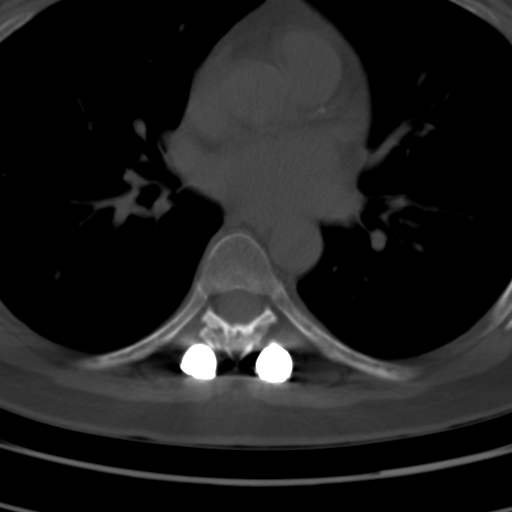
[im 77/111  soft-tissue]
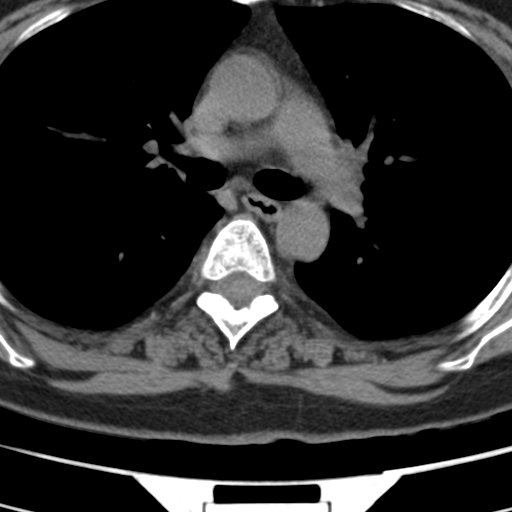
[im 77/111  bone]
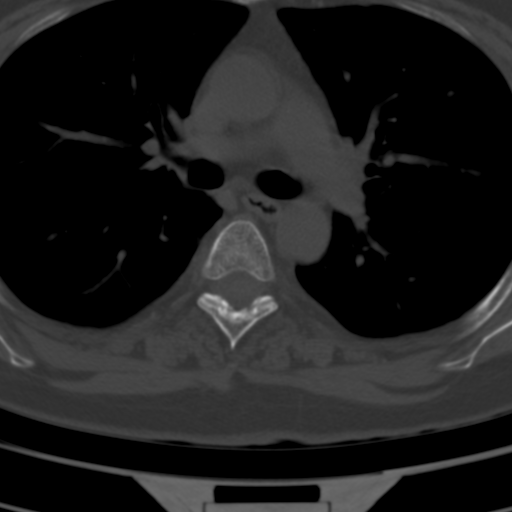
[im 85/111  bone]
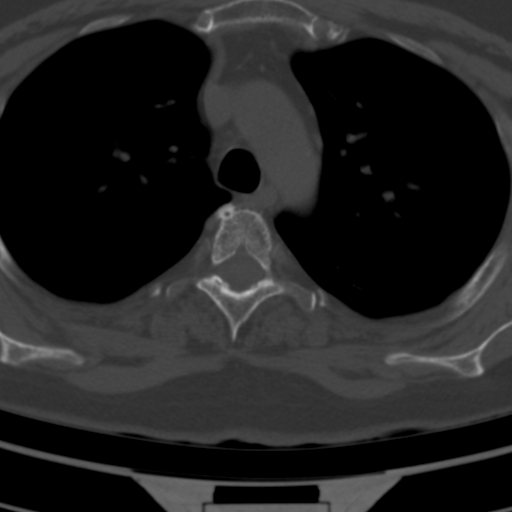
[im 94/111  bone]
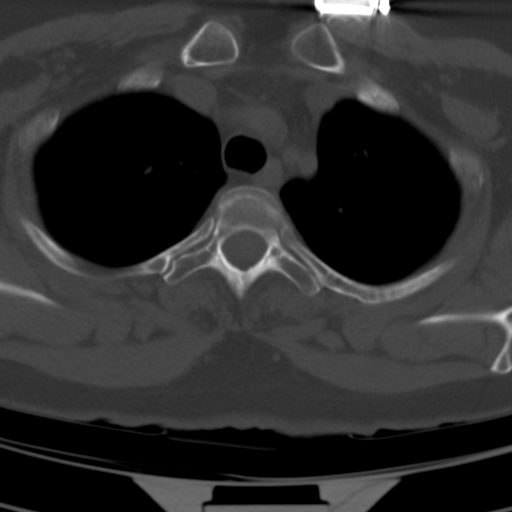
[im 102/111  bone]
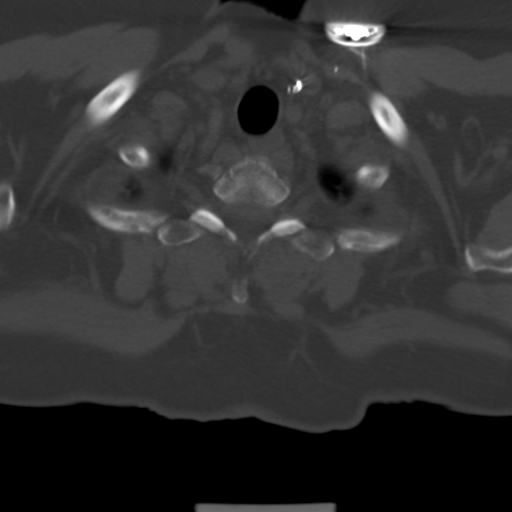

[12 of 14 positions shown; findings below may reference images not displayed]

FINDINGS: Alignment: Normal

Vertebrae: The vertebral body heights are well maintained. No
fracture or pathologic infiltration is seen.

Six

Paraspinal and other soft tissues: Surgical clips in the gallbladder
that are noted. There is atherosclerotic aortic calcification. The
remainder of the paraspinal soft tissues and retroperitoneum are
unremarkable.

Disc levels: The patient is status post decompression and posterior
spinal fixation from T7 through T10. No periprosthetic lucency or
fracture is identified. There is solid posterior osseous fusion
seen. There is mild disc height loss with anterior osteophytes noted
at T3-T4 and within the lower thoracic spine. No significant canal
or neural foraminal narrowing.
IMPRESSION: 1. Status post decompression and posterior spinal fixation from T7
through T10 without hardware complication. Solid posterior osseous
fusion.
2. Mild thoracic spine spondylosis without significant neural
foraminal or canal stenosis.
